# Patient Record
Sex: Female | Born: 1945 | Race: Black or African American | Hispanic: No | Marital: Single | Smoking: Never smoker
Health system: Southern US, Community
[De-identification: ages and names within clinical notes are randomized; demographics above are authoritative.]

## PROBLEM LIST (undated history)

## (undated) DIAGNOSIS — M069 Rheumatoid arthritis, unspecified: Secondary | ICD-10-CM

## (undated) DIAGNOSIS — K219 Gastro-esophageal reflux disease without esophagitis: Secondary | ICD-10-CM

## (undated) DIAGNOSIS — I10 Essential (primary) hypertension: Secondary | ICD-10-CM

## (undated) DIAGNOSIS — E7800 Pure hypercholesterolemia, unspecified: Secondary | ICD-10-CM

## (undated) DIAGNOSIS — C801 Malignant (primary) neoplasm, unspecified: Secondary | ICD-10-CM

## (undated) HISTORY — DX: Pure hypercholesterolemia, unspecified: E78.00

## (undated) HISTORY — DX: Gastro-esophageal reflux disease without esophagitis: K21.9

## (undated) HISTORY — DX: Malignant (primary) neoplasm, unspecified: C80.1

## (undated) HISTORY — PX: TUBAL LIGATION: SHX77

## (undated) HISTORY — PX: DILATION AND CURETTAGE OF UTERUS: SHX78

## (undated) HISTORY — DX: Essential (primary) hypertension: I10

## (undated) HISTORY — PX: COLONOSCOPY W/ BIOPSIES: SHX1374

## (undated) HISTORY — PX: BUNIONECTOMY: SHX129

## (undated) HISTORY — DX: Rheumatoid arthritis, unspecified: M06.9

---

## 2009-07-25 ENCOUNTER — Encounter: Admission: RE | Admit: 2009-07-25 | Discharge: 2009-07-25 | Payer: Self-pay | Admitting: Family Medicine

## 2010-07-26 ENCOUNTER — Encounter: Admission: RE | Admit: 2010-07-26 | Discharge: 2010-07-26 | Payer: Self-pay | Admitting: Family Medicine

## 2011-06-27 ENCOUNTER — Other Ambulatory Visit: Payer: Self-pay | Admitting: Family Medicine

## 2011-06-27 DIAGNOSIS — Z1231 Encounter for screening mammogram for malignant neoplasm of breast: Secondary | ICD-10-CM

## 2011-07-30 ENCOUNTER — Ambulatory Visit
Admission: RE | Admit: 2011-07-30 | Discharge: 2011-07-30 | Disposition: A | Discharge status: Home / Self Care | Payer: Self-pay | Source: Ambulatory Visit | Attending: Family Medicine | Admitting: Family Medicine

## 2011-07-30 DIAGNOSIS — Z1231 Encounter for screening mammogram for malignant neoplasm of breast: Secondary | ICD-10-CM

## 2012-07-21 ENCOUNTER — Other Ambulatory Visit: Payer: Self-pay | Admitting: Rheumatology

## 2012-07-21 ENCOUNTER — Other Ambulatory Visit: Payer: Self-pay | Admitting: Family Medicine

## 2012-07-21 DIAGNOSIS — Z1231 Encounter for screening mammogram for malignant neoplasm of breast: Secondary | ICD-10-CM

## 2012-08-07 ENCOUNTER — Ambulatory Visit
Admission: RE | Admit: 2012-08-07 | Discharge: 2012-08-07 | Disposition: A | Discharge status: Home / Self Care | Payer: Federal, State, Local not specified - PPO | Source: Ambulatory Visit | Attending: Rheumatology | Admitting: Rheumatology

## 2012-08-07 DIAGNOSIS — Z1231 Encounter for screening mammogram for malignant neoplasm of breast: Secondary | ICD-10-CM

## 2013-03-11 ENCOUNTER — Ambulatory Visit (INDEPENDENT_AMBULATORY_CARE_PROVIDER_SITE_OTHER): Payer: Medicare Other | Admitting: Surgery

## 2013-03-11 ENCOUNTER — Encounter (INDEPENDENT_AMBULATORY_CARE_PROVIDER_SITE_OTHER): Payer: Self-pay | Admitting: Surgery

## 2013-03-11 VITALS — BP 204/96 | HR 76 | Resp 24 | Ht 62.5 in | Wt 241.6 lb

## 2013-03-11 DIAGNOSIS — M069 Rheumatoid arthritis, unspecified: Secondary | ICD-10-CM

## 2013-03-11 DIAGNOSIS — C20 Malignant neoplasm of rectum: Secondary | ICD-10-CM | POA: Insufficient documentation

## 2013-03-11 DIAGNOSIS — E66813 Obesity, class 3: Secondary | ICD-10-CM

## 2013-03-11 DIAGNOSIS — C187 Malignant neoplasm of sigmoid colon: Secondary | ICD-10-CM

## 2013-03-11 DIAGNOSIS — K219 Gastro-esophageal reflux disease without esophagitis: Secondary | ICD-10-CM

## 2013-03-11 DIAGNOSIS — E7800 Pure hypercholesterolemia, unspecified: Secondary | ICD-10-CM | POA: Insufficient documentation

## 2013-03-11 DIAGNOSIS — I10 Essential (primary) hypertension: Secondary | ICD-10-CM

## 2013-03-11 HISTORY — DX: Rheumatoid arthritis, unspecified: M06.9

## 2013-03-11 HISTORY — DX: Essential (primary) hypertension: I10

## 2013-03-11 HISTORY — DX: Gastro-esophageal reflux disease without esophagitis: K21.9

## 2013-03-11 HISTORY — DX: Pure hypercholesterolemia, unspecified: E78.00

## 2013-03-11 LAB — BASIC METABOLIC PANEL
Chloride: 102 mEq/L (ref 96–112)
Potassium: 4 mEq/L (ref 3.5–5.3)
Sodium: 139 mEq/L (ref 135–145)

## 2013-03-11 MED ORDER — NEOMYCIN SULFATE 500 MG PO TABS: 1000.0000 mg | ORAL_TABLET | ORAL | Status: DC

## 2013-03-11 MED ORDER — METRONIDAZOLE 500 MG PO TABS: 500.0000 mg | ORAL_TABLET | ORAL | Status: DC

## 2013-03-11 NOTE — Progress Notes (Signed)
Subjective:     Patient ID: Leslie Holmes, female   DOB: 18-Sep-1946, 67 y.o.   MRN: [id number]  HPI  Leslie Holmes  01-Mar-1946 [id number]  Patient Care Team: Devra Dopp, MD as PCP - General (Family Medicine) Ardeth Sportsman, MD as Consulting Physician (General Surgery) Graylin Shiver, MD as Consulting Physician (Gastroenterology)  This patient is a 67 y.o.female who presents today for surgical evaluation at the request of Dr Evette Cristal.   Reason for visit: Newly diagnosed cancer of sigmoid colon  Pleasant elderly woman.  She comes today with her female cousin.  She underwent a screening colonoscopy.  Found to have mass in sigmoid colon.  Not able to be lifted off endoscopically.  Biopsy done.  Tattooed.  Biopsy consistent with cancer.  Sent to me for surgical resection.  Patient normally has a bowel movement twice a day.  Has returned arthritis that can walk about 20 minutes before stopping secondary to joint and back pain.  On methotrexate twice a week.  Had a tubal ligation but no other abdominal surgeries.  No history of skin infections.  No personal nor family history of GI/colon cancer, inflammatory bowel disease, irritable bowel syndrome, allergy such as Celiac Sprue, dietary/dairy problems, colitis, ulcers nor gastritis.  No recent sick contacts/gastroenteritis.  No travel outside the country.  No changes in diet.  No nausea or vomiting.  No history of obstruction.  No rectal bleeding.  Patient Active Problem List   Diagnosis Date Noted  . Cancer of sigmoid colon, 25mm 03/11/2013  . HTN (hypertension) 03/11/2013  . GERD (gastroesophageal reflux disease) 03/11/2013  . Hypercholesteremia 03/11/2013  . Obesity, Class III, BMI 40-49.9 (morbid obesity) 03/11/2013  . Rheumatoid arthritis 03/11/2013    Past Medical History  Diagnosis Date  . Cancer     sigmoid   . HTN (hypertension) 03/11/2013  . GERD (gastroesophageal reflux disease) 03/11/2013  . Hypercholesteremia 03/11/2013  .  Rheumatoid arthritis 03/11/2013    Takes Methotrexate 2x/week     Past Surgical History  Procedure Laterality Date  . Bunionectomy    . Tubal ligation    . Dilation and curettage of uterus      History   Social History  . Marital Status: Single    Spouse Name: N/A    Number of Children: N/A  . Years of Education: N/A   Occupational History  . Not on file.   Social History Main Topics  . Smoking status: Never Smoker   . Smokeless tobacco: Never Used  . Alcohol Use: Yes     Comment: rare  . Drug Use: No  . Sexually Active: Not on file   Other Topics Concern  . Not on file   Social History Narrative  . No narrative on file    Family History  Problem Relation Age of Onset  . Heart disease Mother   . Hypertension Mother     Current Outpatient Prescriptions  Medication Sig Dispense Refill  . folic acid (FOLVITE) 1 MG tablet       . lisinopril-hydrochlorothiazide (PRINZIDE,ZESTORETIC) 20-25 MG per tablet       . methotrexate (RHEUMATREX) 2.5 MG tablet       . pravastatin (PRAVACHOL) 40 MG tablet        No current facility-administered medications for this visit.     No Known Allergies  BP 204/96  Pulse 76  Resp 24  Ht 5' 2.5" (1.588 m)  Wt 241 lb 9.6 oz (109.589 kg)  BMI 43.46  kg/m2  No results found.   Review of Systems  Constitutional: Negative for fever, chills, diaphoresis, appetite change and fatigue.  HENT: Negative for ear pain, sore throat, trouble swallowing, neck pain and ear discharge.   Eyes: Negative for photophobia, discharge and visual disturbance.  Respiratory: Negative for cough, choking, chest tightness and shortness of breath.   Cardiovascular: Negative for chest pain, palpitations and leg swelling.  Gastrointestinal: Negative for nausea, vomiting, abdominal pain, diarrhea, constipation, blood in stool, abdominal distention, anal bleeding and rectal pain.  Endocrine: Negative for cold intolerance and heat intolerance.  Genitourinary:  Negative for dysuria, frequency and difficulty urinating.  Musculoskeletal: Positive for myalgias, back pain and arthralgias. Negative for gait problem.  Skin: Negative for color change, pallor and rash.  Allergic/Immunologic: Positive for immunocompromised state. Negative for environmental allergies and food allergies.  Neurological: Negative for dizziness, speech difficulty, weakness and numbness.  Hematological: Negative for adenopathy.  Psychiatric/Behavioral: Negative for confusion and agitation. The patient is not nervous/anxious.        Objective:   Physical Exam  Constitutional: She is oriented to person, place, and time. She appears well-developed and well-nourished. No distress.  HENT:  Head: Normocephalic.  Mouth/Throat: Oropharynx is clear and moist. No oropharyngeal exudate.  Eyes: Conjunctivae and EOM are normal. Pupils are equal, round, and reactive to light. No scleral icterus.  Neck: Normal range of motion. Neck supple. No tracheal deviation present.  Cardiovascular: Normal rate, regular rhythm and intact distal pulses.   Pulmonary/Chest: Effort normal and breath sounds normal. No stridor. No respiratory distress. She exhibits no tenderness.  Abdominal: Soft. She exhibits no distension and no mass. There is no tenderness. No hernia. Hernia confirmed negative in the right inguinal area and confirmed negative in the left inguinal area.  Obese with moderate panniculus.  Apple -like central obesity  Genitourinary: No vaginal discharge found.  Musculoskeletal: Normal range of motion. She exhibits no tenderness.       Right elbow: She exhibits normal range of motion.       Left elbow: She exhibits normal range of motion.       Right wrist: She exhibits normal range of motion.       Left wrist: She exhibits normal range of motion.       Right hand: Normal strength noted.       Left hand: Normal strength noted.  Lymphadenopathy:       Head (right side): No posterior auricular  adenopathy present.       Head (left side): No posterior auricular adenopathy present.    She has no cervical adenopathy.    She has no axillary adenopathy.       Right: No inguinal adenopathy present.       Left: No inguinal adenopathy present.  Neurological: She is alert and oriented to person, place, and time. No cranial nerve deficit. She exhibits normal muscle tone. Coordination normal.  Skin: Skin is warm and dry. No rash noted. She is not diaphoretic. No erythema.  Psychiatric: She has a normal mood and affect. Her behavior is normal. Judgment and thought content normal.       Assessment:     Cancer of sigmoid colon.  25 mm.     Plan:     I recommended laparoscopic segmental colonic resection:  The anatomy & physiology of the digestive tract was discussed.  The pathophysiology was discussed.  Natural history risks without surgery was discussed.   I feel the risks of no intervention  will lead to serious problems that outweigh the operative risks; therefore, I recommended a partial colectomy to remove the pathology.  Laparoscopic & open techniques were discussed.   Risks such as bleeding, infection, abscess, leak, reoperation, possible ostomy, hernia, heart attack, death, and other risks were discussed.  I noted a good likelihood this will help address the problem.   Goals of post-operative recovery were discussed as well.  We will work to minimize complications.  An educational handout on the pathology was given as well.  Questions were answered.  The patient & her cousin express understanding & wish to proceed with surgery.  We will obtain CEA and CT of chest/abdomen/pelvis for proper staging.  Hold off on CXR since CT scan being done.

## 2013-03-11 NOTE — Patient Instructions (Addendum)
See the Handout(s) we gave you.  Consider surgery.  Please call our office at [phone] if you wish to schedule surgery or if you have further questions / concerns.   Colorectal Cancer Colorectal cancer is an abnormal growth of tissue (tumor) in the colon or rectum that is cancerous (malignant). Unlike noncancerous (benign) tumors, malignant tumors can spread to other parts of your body. The colon is the large bowel or large intestine. The rectum is the last several inches of the colon. CAUSES  The exact cause of colon cancer is unknown.  RISK FACTORS The majority of patients do not have identifiable risk factors, but the following factors may increase your chances of getting colon cancer:  Age. Most colorectal cancers occur in people older than 50 years.  Having abnormal growths (polyps) on the inner wall of the colon or rectum.  Diabetes.  Being African American.  Family history of hereditary nonpolyposis colon cancer. This condition is caused by changes in the genes that are responsible for repairing mismatched DNA.  Family history of familial adenomatous polyposis (FAP). This is a rare, inherited condition in which hundreds of polyps form in the colon and rectum. It is caused by a change in the APC gene. Unless FAP is treated, it usually leads to colorectal cancer by age 37.  Personal history of cancer. A person who has already had colorectal cancer may develop it a second time. Also, women with a history of ovarian, uterine, or breast cancer are at a somewhat higher risk of developing colorectal cancer.  Inflammatory bowel disease, including ulcerative colitis and Crohn's disease.  Being obese or eating a diet that is high in fat (especially animal fat) and low in fiber, fruits, and vegetables.  Smoking. A person who smokes cigarettes may be at increased risk of developing polyps and colorectal cancer.  Heavy alcohol use. SYMPTOMS Early colorectal cancer often does not  cause symptoms. As the cancer grows, symptoms may include:  Diarrhea.  Constipation.  Feeling like the bowel does not empty completely after a bowel movement.  Blood in the stool.  Stools that are narrower than usual.  Abdominal discomfort, pain, bloating, fullness, or cramps.  Unexplained weight loss.  Constant tiredness.  Nausea and vomiting. DIAGNOSIS  Your caregiver will ask about your medical history. He or she may also perform a number of procedures, such as:  A physical exam.  Blood tests. This may include a routine complete blood count and iron level testing. Your caregiver may also check for carcinoembryonic antigen (CEA) and other substances in the blood. Some people who have colorectal cancer have a high CEA level. These levels may be used to follow the activity of your colon cancer.  Chest X-rays, computed tomography (CT) scans, or magnetic resonance imaging (MRI).  Taking a tissue sample (biopsy) from the colon or rectum. The sample is examined under a microscope to look for cancer cells.  Sigmoidoscopy. With this test, your caregiver can see inside your colon. A thin flexible tube (sigmoidoscope) is placed into your rectum. This device has a light source and a tiny video camera in it. Your caregiver uses the sigmoidoscope to look at the last third of your colon.  Colonoscopy. This test is like sigmoidoscopy, but your caregiver looks at the entire colon. This test usually requires medicine that helps you relax (sedative).  Endorectal ultrasound. With this test, your caregiver can see how deep a rectal tumor has grown and whether the cancer has spread to lymph nodes or  other nearby tissues. A tool (probe) is inserted into the rectum. The probe sends out sound waves to the rectum and nearby tissues, and a computer uses the echoes to create a picture. Your cancer will be staged to determine its severity and extent. Staging is a careful attempt to find out the size of the  tumor, whether the cancer has spread, and if so, to what parts of the body. You may need to have more tests to determine the stage of your cancer. The test results will help determine what treatment plan is best for you. STAGES  Stage 0. The cancer is found only in the innermost lining of the colon or rectum.  Stage I. The cancer has grown into the inner wall of the colon or rectum. The cancer has not yet reached the outer wall of the colon.  Stage II. The cancer extends more deeply into or through the wall of the colon or rectum. It may have invaded nearby tissue, but cancer cells have not spread to the lymph nodes.  Stage III. The cancer has spread to nearby lymph nodes but not to other parts of the body.  Stage IV. The cancer has spread to other parts of the body, such as the liver or lungs. Your caregiver may tell you the detailed stage of your cancer. In that case, the stage will include both a number and a letter. TREATMENT  Depending on the type and stage, colorectal cancer may be treated with surgery, radiation therapy, or chemotherapy. Some patients have a combination of these therapies.  Surgery may be done to remove the polyps from your colon. In early stages, your caregiver may be able to do this during a colonoscopy. In later stages, surgery may be done to remove part of your colon.  Radiation therapy uses high-energy rays to kill cancer cells. This is usually recommended for patients with rectal cancer.  Chemotherapy is the use of drugs to kill cancer cells. Caregivers also give chemotherapy to help reduce pain and other problems caused by colorectal cancer. This may be done even if the cancer is not curable. HOME CARE INSTRUCTIONS   Only take over-the-counter or prescription medicines for pain, discomfort, or fever as directed by your caregiver.  Maintain a healthy diet.  Consider joining a support group. This may help you learn to cope with the stress of having colorectal  cancer.  Seek advice to help you manage treatment side effects.  Keep all follow-up appointments as directed by your caregiver.  Inform your cancer specialist if you are admitted to the hospital. SEEK IMMEDIATE MEDICAL CARE IF:   Your diarrhea or constipation does not go away.  You have alternating constipation and diarrhea.  You have blood in your stools.  Your abdominal pain gets worse.  You lose weight without trying.  You notice new fatigue or weakness.  You develop a fever during chemotherapy treatment. Document Released: 10/15/2005 Document Revised: 01/07/2012 Document Reviewed: 10/02/2011 [facility] Patient Information 2013 [place], [place].   COLON PREP INSTRUCTIONS for lower/distal colectomy:   Obtain what you need at a pharmacy of your choice:      Prescriptions for your oral antibiotics (Neomycin & Metronidazole)     A bottle of Milk of Magnesia    2 Fleet enemas (generic form OK to use)   DAY PRIOR TO SURGERY:    1:00pm    o Take 2 oz (4 tablespoons) Milk of Magnesia. o Take 2 Neomycin 500mg  tablets & 2 Metronidazole 500mg  tablets  3:00pm:    o Take 2 Neomycin 500mg  tablets & 2 Metronidazole 500mg  tablets     Bedtime (~10:00pm)  o Take 2 Neomycin 500mg  tablets & 2 Metronidazole 500mg  tablets    Midnight:  Do not eat or drink anything after midnight the night before your surgery.   MORNING OF PROCEDURE:    Remember to not to drink or eat anything that morning    Upon waking up, take the 2 Fleet enemas.  o Use at least 1 hour before leaving house o Try to retain each enema for 5-10 minutes before expelling it.  This should clean your lower colon sufficiently   If you have questions or problems, please call [facility] [phone] speak to someone in the clinic department at our office    ABDOMINAL SURGERY: POST OP INSTRUCTIONS  1. DIET: Follow a light bland diet the first 24 hours after arrival home, such as soup, liquids,  crackers, etc.  Be sure to include lots of fluids daily.  Avoid fast food or heavy meals as your are more likely to get nauseated.  Eat a low fat the next few days after surgery.   2. Take your usually prescribed home medications unless otherwise directed. 3. PAIN CONTROL: a. Pain is best controlled by a usual combination of three different methods TOGETHER: i. Ice/Heat ii. Over the counter pain medication iii. Prescription pain medication b. Most patients will experience some swelling and bruising around the incisions.  Ice packs or heating pads (30-60 minutes up to 6 times a day) will help. Use ice for the first few days to help decrease swelling and bruising, then switch to heat to help relax tight/sore spots and speed recovery.  Some people prefer to use ice alone, heat alone, alternating between ice & heat.  Experiment to what works for you.  Swelling and bruising can take several weeks to resolve.   c. It is helpful to take an over-the-counter pain medication regularly for the first few weeks.  Choose one of the following that works best for you: i. Naproxen (Aleve, etc)  Two 220mg  tabs twice a day ii. Ibuprofen (Advil, etc) Three 200mg  tabs four times a day (every meal & bedtime) iii. Acetaminophen (Tylenol, etc) 500-650mg  four times a day (every meal & bedtime) d. A  prescription for pain medication (such as oxycodone, hydrocodone, etc) should be given to you upon discharge.  Take your pain medication as prescribed.  i. If you are having problems/concerns with the prescription medicine (does not control pain, nausea, vomiting, rash, itching, etc), please call us [phone] to see if we need to switch you to a different pain medicine that will work better for you and/or control your side effect better. ii. If you need a refill on your pain medication, please contact your pharmacy.  They will contact our office to request authorization. Prescriptions will not be filled after 5 pm or on  week-ends. 4. Avoid getting constipated.  Between the surgery and the pain medications, it is common to experience some constipation.  Increasing fluid intake and taking a fiber supplement (such as Metamucil, Citrucel, FiberCon, MiraLax, etc) 1-2 times a day regularly will usually help prevent this problem from occurring.  A mild laxative (prune juice, Milk of Magnesia, MiraLax, etc) should be taken according to package directions if there are no bowel movements after 48 hours.   5. Watch out for diarrhea.  If you have many loose bowel movements, simplify your diet to bland foods &  liquids for a few days.  Stop any stool softeners and decrease your fiber supplement.  Switching to mild anti-diarrheal medications (Kayopectate, Pepto Bismol) can help.  If this worsens or does not improve, please call us. 6. Wash / shower every day.  You may shower over the incision / wound.  Avoid baths until the skin is fully healed.  Continue to shower over incision(s) after the dressing is off. 7. Remove your waterproof bandages 5 days after surgery.  You may leave the incision open to air.  You may replace a dressing/Band-Aid to cover the incision for comfort if you wish. 8. ACTIVITIES as tolerated:   a. You may resume regular (light) daily activities beginning the next day-such as daily self-care, walking, climbing stairs-gradually increasing activities as tolerated.  If you can walk 30 minutes without difficulty, it is safe to try more intense activity such as jogging, treadmill, bicycling, low-impact aerobics, swimming, etc. b. Save the most intensive and strenuous activity for last such as sit-ups, heavy lifting, contact sports, etc  Refrain from any heavy lifting or straining until you are off narcotics for pain control.   c. DO NOT PUSH THROUGH PAIN.  Let pain be your guide: If it hurts to do something, don't do it.  Pain is your body warning you to avoid that activity for another week until the pain goes  down. d. You may drive when you are no longer taking prescription pain medication, you can comfortably wear a seatbelt, and you can safely maneuver your car and apply brakes. e. Bonita Quin may have sexual intercourse when it is comfortable.  9. FOLLOW UP in our office a. Please call [facility] at [phone] to set up an appointment to see your surgeon in the office for a follow-up appointment approximately 1-2 weeks after your surgery. b. Make sure that you call for this appointment the day you arrive home to insure a convenient appointment time. 10. IF YOU HAVE DISABILITY OR FAMILY LEAVE FORMS, BRING THEM TO THE OFFICE FOR PROCESSING.  DO NOT GIVE THEM TO YOUR DOCTOR.   WHEN TO CALL us [phone]: 1. Poor pain control 2. Reactions / problems with new medications (rash/itching, nausea, etc)  3. Fever over 101.5 F (38.5 C) 4. Inability to urinate 5. Nausea and/or vomiting 6. Worsening swelling or bruising 7. Continued bleeding from incision. 8. Increased pain, redness, or drainage from the incision  The clinic staff is available to answer your questions during regular business hours (8:30am-5pm).  Please don't hesitate to call and ask to speak to one of our nurses for clinical concerns.   A surgeon from [facility] is always on call at the hospitals   If you have a medical emergency, go to the nearest emergency room or call 911.    [facility] [address], [place], [place]  [address] ? MAIN: [phone] ? TOLL FREE: [phone] ? FAX [phone] [email or web address]   GETTING TO GOOD BOWEL HEALTH. Irregular bowel habits such as constipation and diarrhea can lead to many problems over time.  Having one soft bowel movement a day is the most important way to prevent further problems.  The anorectal canal is designed to handle stretching and feces to safely manage our ability to get rid of solid waste (feces, poop, stool) out of  our body.  BUT, hard constipated stools can act like ripping concrete bricks and diarrhea can be a burning fire to this very sensitive area of  our body, causing inflamed hemorrhoids, anal fissures, increasing risk is perirectal abscesses, abdominal pain/bloating, an making irritable bowel worse.     The goal: ONE SOFT BOWEL MOVEMENT A DAY!  To have soft, regular bowel movements:    Drink at least 8 tall glasses of water a day.     Take plenty of fiber.  Fiber is the undigested part of plant food that passes into the colon, acting s "natures broom" to encourage bowel motility and movement.  Fiber can absorb and hold large amounts of water. This results in a larger, bulkier stool, which is soft and easier to pass. Work gradually over several weeks up to 6 servings a day of fiber (25g a day even more if needed) in the form of: o Vegetables -- Root (potatoes, carrots, turnips), leafy green (lettuce, salad greens, celery, spinach), or cooked high residue (cabbage, broccoli, etc) o Fruit -- Fresh (unpeeled skin & pulp), Dried (prunes, apricots, cherries, etc ),  or stewed ( applesauce)  o Whole grain breads, pasta, etc (whole wheat)  o Bran cereals    Bulking Agents -- This type of water-retaining fiber generally is easily obtained each day by one of the following:  o Psyllium bran -- The psyllium plant is remarkable because its ground seeds can retain so much water. This product is available as Metamucil, Konsyl, Effersyllium, Per Diem Fiber, or the less expensive generic preparation in drug and health food stores. Although labeled a laxative, it really is not a laxative.  o Methylcellulose -- This is another fiber derived from wood which also retains water. It is available as Citrucel. o Polyethylene Glycol - and "artificial" fiber commonly called Miralax or Glycolax.  It is helpful for people with gassy or bloated feelings with regular fiber o Flax Seed - a less gassy fiber than psyllium   No reading or  other relaxing activity while on the toilet. If bowel movements take longer than 5 minutes, you are too constipated   AVOID CONSTIPATION.  High fiber and water intake usually takes care of this.  Sometimes a laxative is needed to stimulate more frequent bowel movements, but    Laxatives are not a good long-term solution as it can wear the colon out. o Osmotics (Milk of Magnesia, Fleets phosphosoda, Magnesium citrate, MiraLax, GoLytely) are safer than  o Stimulants (Senokot, Castor Oil, Dulcolax, Ex Lax)    o Do not take laxatives for more than 7days in a row.    IF SEVERELY CONSTIPATED, try a Bowel Retraining Program: o Do not use laxatives.  o Eat a diet high in roughage, such as bran cereals and leafy vegetables.  o Drink six (6) ounces of prune or apricot juice each morning.  o Eat two (2) large servings of stewed fruit each day.  o Take one (1) heaping tablespoon of a psyllium-based bulking agent twice a day. Use sugar-free sweetener when possible to avoid excessive calories.  o Eat a normal breakfast.  o Set aside 15 minutes after breakfast to sit on the toilet, but do not strain to have a bowel movement.  o If you do not have a bowel movement by the third day, use an enema and repeat the above steps.    Controlling diarrhea o Switch to liquids and simpler foods for a few days to avoid stressing your intestines further. o Avoid dairy products (especially milk & ice cream) for a short time.  The intestines often can lose the ability to digest lactose when stressed.  o Avoid foods that cause gassiness or bloating.  Typical foods include beans and other legumes, cabbage, broccoli, and dairy foods.  Every person has some sensitivity to other foods, so listen to our body and avoid those foods that trigger problems for you. o Adding fiber (Citrucel, Metamucil, psyllium, Miralax) gradually can help thicken stools by absorbing excess fluid and retrain the intestines to act more normally.  Slowly  increase the dose over a few weeks.  Too much fiber too soon can backfire and cause cramping & bloating. o Probiotics (such as active yogurt, Align, etc) may help repopulate the intestines and colon with normal bacteria and calm down a sensitive digestive tract.  Most studies show it to be of mild help, though, and such products can be costly. o Medicines:   Bismuth subsalicylate (ex. Kayopectate, Pepto Bismol) every 30 minutes for up to 6 doses can help control diarrhea.  Avoid if pregnant.   Loperamide (Immodium) can slow down diarrhea.  Start with two tablets (4mg  total) first and then try one tablet every 6 hours.  Avoid if you are having fevers or severe pain.  If you are not better or start feeling worse, stop all medicines and call your doctor for advice o Call your doctor if you are getting worse or not better.  Sometimes further testing (cultures, endoscopy, X-ray studies, bloodwork, etc) may be needed to help diagnose and treat the cause of the diarrhea.  Managing Pain  Pain after surgery or related to activity is often due to strain/injury to muscle, tendon, nerves and/or incisions.  This pain is usually short-term and will improve in a few months.   Many people find it helpful to do the following things TOGETHER to help speed the process of healing and to get back to regular activity more quickly:  1. Avoid heavy physical activity a.  no lifting greater than 20 pounds b. Do not "push through" the pain.  Listen to your body and avoid positions and maneuvers than reproduce the pain c. Walking is okay as tolerated, but go slowly and stop when getting sore.  d. Remember: If it hurts to do it, then don't do it! 2. Take Anti-inflammatory medication  a. Take with food/snack around the clock for 1-2 weeks i. This helps the muscle and nerve tissues become less irritable and calm down faster b. Choose ONE of the following over-the-counter medications: i. Naproxen 220mg  tabs (ex. Aleve) 1-2  pills twice a day  ii. Ibuprofen 200mg  tabs (ex. Advil, Motrin) 3-4 pills with every meal and just before bedtime iii. Acetaminophen 500mg  tabs (Tylenol) 1-2 pills with every meal and just before bedtime 3. Use a Heating pad or Ice/Cold Pack a. 4-6 times a day b. May use warm bath/hottub  or showers 4. Try Gentle Massage and/or Stretching  a. at the area of pain many times a day b. stop if you feel pain - do not overdo it  Try these steps together to help you body heal faster and avoid making things get worse.  Doing just one of these things may not be enough.    If you are not getting better after two weeks or are noticing you are getting worse, contact our office for further advice; we may need to re-evaluate you & see what other things we can do to help.

## 2013-03-12 ENCOUNTER — Encounter (INDEPENDENT_AMBULATORY_CARE_PROVIDER_SITE_OTHER): Payer: Self-pay | Admitting: Surgery

## 2013-03-12 LAB — CEA: CEA: 0.7 ng/mL (ref 0.0–5.0)

## 2013-03-13 ENCOUNTER — Ambulatory Visit
Admission: RE | Admit: 2013-03-13 | Discharge: 2013-03-13 | Disposition: A | Discharge status: Home / Self Care | Payer: Medicare Other | Source: Ambulatory Visit | Attending: Surgery | Admitting: Surgery

## 2013-03-13 ENCOUNTER — Other Ambulatory Visit: Payer: Federal, State, Local not specified - PPO

## 2013-03-13 DIAGNOSIS — C187 Malignant neoplasm of sigmoid colon: Secondary | ICD-10-CM

## 2013-03-13 MED ORDER — IOHEXOL 300 MG/ML  SOLN
125.0000 mL | Freq: Once | INTRAMUSCULAR | Status: AC | PRN
  Administered 2013-03-13: 125 mL via INTRAVENOUS

## 2013-03-16 ENCOUNTER — Telehealth (INDEPENDENT_AMBULATORY_CARE_PROVIDER_SITE_OTHER): Payer: Self-pay

## 2013-03-16 NOTE — Telephone Encounter (Signed)
Patient called back and was given below message.

## 2013-03-16 NOTE — Telephone Encounter (Signed)
LMOM at home and cell. I wanted to give her the good news about her CT Chest,Abd/pelvis scans showing no metastatic disease per Dr Michaell Cowing.

## 2013-03-19 ENCOUNTER — Encounter (INDEPENDENT_AMBULATORY_CARE_PROVIDER_SITE_OTHER): Payer: Self-pay

## 2013-03-30 ENCOUNTER — Encounter (HOSPITAL_COMMUNITY): Payer: Self-pay | Admitting: Pharmacy Technician

## 2013-04-02 ENCOUNTER — Encounter (HOSPITAL_COMMUNITY)
Admission: RE | Admit: 2013-04-02 | Discharge: 2013-04-02 | Disposition: A | Discharge status: Home / Self Care | Payer: Medicare Other | Source: Ambulatory Visit | Attending: Surgery | Admitting: Surgery

## 2013-04-02 ENCOUNTER — Encounter (HOSPITAL_COMMUNITY): Payer: Self-pay

## 2013-04-02 LAB — ABO/RH: ABO/RH(D): B NEG

## 2013-04-02 LAB — CBC
MCH: 32.4 pg (ref 26.0–34.0)
Platelets: 280 10*3/uL (ref 150–400)
RBC: 4.29 MIL/uL (ref 3.87–5.11)

## 2013-04-02 NOTE — Patient Instructions (Addendum)
22 Leslie Holmes  04/02/2013   Your procedure is scheduled on:  04/10/13  FRIDAY  Report to [facility] at  0530     AM.  Call this number if you have problems the morning of surgery: [phone]       Remember:  NOTHING BY MOUTH EXCEPT MEDICATIONS AFTER MIDNIGHT Thursday NIGHT   Take these medicines the morning of surgery with A SIP OF WATER: NONE BOWEL PREP AS PER OFFICE  .  Contacts, dentures or partial plates can not be worn to surgery  Leave suitcase in the car. After surgery it may be brought to your room.  For patients admitted to the hospital, checkout time is 11:00 AM day of  discharge.             SPECIAL INSTRUCTIONS- SEE [facility] PREPARING FOR SURGERY INSTRUCTION SHEET-     DO NOT WEAR JEWELRY, LOTIONS, POWDERS, OR PERFUMES.  WOMEN-- DO NOT SHAVE LEGS OR UNDERARMS FOR 12 HOURS BEFORE SHOWERS. MEN MAY SHAVE FACE.  Patients discharged the day of surgery will not be allowed to drive home. IF going home the day of surgery, you must have a driver and someone to stay with you for the first 24 hours  Name and phone number of your driver:        admission                                                                Please read over the following fact sheets that you were given: MRSA Information, Incentive Spirometry Sheet, Blood Transfusion Sheet  Information                                                                                   Manav Pierotti  PST 336  [phone]                 FAILURE TO FOLLOW THESE INSTRUCTIONS MAY RESULT IN  CANCELLATION   OF YOUR SURGERY                                                  Patient Signature _____________________________

## 2013-04-02 NOTE — Progress Notes (Signed)
Per Dr Rica Mast, anesthesia, OK for patient to take Methyltrexate next week.  CT chest 03/13/13 EPIC, BMET 03/11/13 EPIC

## 2013-04-09 MED ORDER — BUPIVACAINE 0.25 % ON-Q PUMP DUAL CATH 300 ML
300.0000 mL | INJECTION | Status: DC
  Filled 2013-04-09: qty 300

## 2013-04-09 MED ORDER — GENTAMICIN SULFATE 40 MG/ML IJ SOLN
INTRAMUSCULAR | Status: DC
  Filled 2013-04-09: qty 6

## 2013-04-10 ENCOUNTER — Inpatient Hospital Stay (HOSPITAL_COMMUNITY)
Admission: RE | Admit: 2013-04-10 | Discharge: 2013-04-13 | DRG: 333 | Disposition: A | Discharge status: Home / Self Care | Payer: Medicare Other | Source: Ambulatory Visit | Attending: Surgery | Admitting: Surgery

## 2013-04-10 ENCOUNTER — Inpatient Hospital Stay (HOSPITAL_COMMUNITY): Payer: Medicare Other | Admitting: Anesthesiology

## 2013-04-10 ENCOUNTER — Encounter (HOSPITAL_COMMUNITY): Payer: Self-pay | Admitting: Anesthesiology

## 2013-04-10 ENCOUNTER — Encounter (HOSPITAL_COMMUNITY)
Admission: RE | Disposition: A | Discharge status: Home / Self Care | Payer: Self-pay | Source: Ambulatory Visit | Attending: Surgery

## 2013-04-10 ENCOUNTER — Encounter (HOSPITAL_COMMUNITY): Payer: Self-pay | Admitting: *Deleted

## 2013-04-10 DIAGNOSIS — C20 Malignant neoplasm of rectum: Principal | ICD-10-CM | POA: Diagnosis present

## 2013-04-10 DIAGNOSIS — K219 Gastro-esophageal reflux disease without esophagitis: Secondary | ICD-10-CM | POA: Diagnosis present

## 2013-04-10 DIAGNOSIS — C187 Malignant neoplasm of sigmoid colon: Secondary | ICD-10-CM

## 2013-04-10 DIAGNOSIS — Z01812 Encounter for preprocedural laboratory examination: Secondary | ICD-10-CM

## 2013-04-10 DIAGNOSIS — I10 Essential (primary) hypertension: Secondary | ICD-10-CM | POA: Diagnosis present

## 2013-04-10 DIAGNOSIS — Z6841 Body Mass Index (BMI) 40.0 and over, adult: Secondary | ICD-10-CM

## 2013-04-10 DIAGNOSIS — E7800 Pure hypercholesterolemia, unspecified: Secondary | ICD-10-CM | POA: Diagnosis present

## 2013-04-10 DIAGNOSIS — M069 Rheumatoid arthritis, unspecified: Secondary | ICD-10-CM | POA: Diagnosis present

## 2013-04-10 HISTORY — PX: LAPAROSCOPIC PARTIAL COLECTOMY: SHX5907

## 2013-04-10 LAB — TYPE AND SCREEN
ABO/RH(D): B NEG
Antibody Screen: NEGATIVE

## 2013-04-10 SURGERY — LAPAROSCOPIC PARTIAL COLECTOMY
Anesthesia: General | Wound class: Contaminated

## 2013-04-10 MED ORDER — BUPIVACAINE ON-Q PAIN PUMP (FOR ORDER SET NO CHG)
INJECTION | Status: DC
  Filled 2013-04-10: qty 1

## 2013-04-10 MED ORDER — SODIUM CHLORIDE 0.9 % IJ SOLN: 3.0000 mL | INTRAMUSCULAR | Status: DC | PRN

## 2013-04-10 MED ORDER — SACCHAROMYCES BOULARDII 250 MG PO CAPS
250.0000 mg | ORAL_CAPSULE | Freq: Two times a day (BID) | ORAL | Status: DC
  Administered 2013-04-10 – 2013-04-13 (×6): 250 mg via ORAL
  Filled 2013-04-10 (×7): qty 1

## 2013-04-10 MED ORDER — ALVIMOPAN 12 MG PO CAPS
12.0000 mg | ORAL_CAPSULE | Freq: Two times a day (BID) | ORAL | Status: DC
  Administered 2013-04-11: 12 mg via ORAL
  Filled 2013-04-10 (×4): qty 1

## 2013-04-10 MED ORDER — HEPARIN SODIUM (PORCINE) 5000 UNIT/ML IJ SOLN
5000.0000 [IU] | Freq: Three times a day (TID) | INTRAMUSCULAR | Status: DC
  Administered 2013-04-11 – 2013-04-13 (×7): 5000 [IU] via SUBCUTANEOUS
  Filled 2013-04-10 (×10): qty 1

## 2013-04-10 MED ORDER — ALUM & MAG HYDROXIDE-SIMETH 200-200-20 MG/5ML PO SUSP: 30.0000 mL | Freq: Four times a day (QID) | ORAL | Status: DC | PRN

## 2013-04-10 MED ORDER — ONDANSETRON HCL 4 MG/2ML IJ SOLN
INTRAMUSCULAR | Status: DC | PRN
  Administered 2013-04-10: 4 mg via INTRAVENOUS

## 2013-04-10 MED ORDER — MIDAZOLAM HCL 5 MG/5ML IJ SOLN
INTRAMUSCULAR | Status: DC | PRN
  Administered 2013-04-10: 2 mg via INTRAVENOUS

## 2013-04-10 MED ORDER — BUPIVACAINE 0.25 % ON-Q PUMP DUAL CATH 300 ML
INJECTION | Status: DC | PRN
  Administered 2013-04-10: 300 mL

## 2013-04-10 MED ORDER — NEOSTIGMINE METHYLSULFATE 1 MG/ML IJ SOLN
INTRAMUSCULAR | Status: DC | PRN
  Administered 2013-04-10: 5 mg via INTRAVENOUS

## 2013-04-10 MED ORDER — DEXAMETHASONE SODIUM PHOSPHATE 10 MG/ML IJ SOLN
INTRAMUSCULAR | Status: DC | PRN
  Administered 2013-04-10: 5 mg via INTRAVENOUS

## 2013-04-10 MED ORDER — ALVIMOPAN 12 MG PO CAPS
12.0000 mg | ORAL_CAPSULE | Freq: Once | ORAL | Status: AC
  Administered 2013-04-10: 12 mg via ORAL
  Filled 2013-04-10: qty 1

## 2013-04-10 MED ORDER — LACTATED RINGERS IR SOLN
Status: DC | PRN
  Administered 2013-04-10: 3000 mL

## 2013-04-10 MED ORDER — DEXTROSE 5 % IV SOLN
1.0000 g | Freq: Four times a day (QID) | INTRAVENOUS | Status: AC
  Administered 2013-04-10 – 2013-04-11 (×3): 1 g via INTRAVENOUS
  Filled 2013-04-10 (×3): qty 1

## 2013-04-10 MED ORDER — DEXTROSE 5 % IV SOLN: 2.0000 g | Freq: Two times a day (BID) | INTRAVENOUS | Status: DC

## 2013-04-10 MED ORDER — SODIUM CHLORIDE 0.9 % IV SOLN: 250.0000 mL | INTRAVENOUS | Status: DC | PRN

## 2013-04-10 MED ORDER — ROCURONIUM BROMIDE 100 MG/10ML IV SOLN
INTRAVENOUS | Status: DC | PRN
  Administered 2013-04-10: 50 mg via INTRAVENOUS

## 2013-04-10 MED ORDER — KCL IN DEXTROSE-NACL 40-5-0.9 MEQ/L-%-% IV SOLN
INTRAVENOUS | Status: DC
  Administered 2013-04-10 – 2013-04-13 (×3): via INTRAVENOUS
  Filled 2013-04-10 (×6): qty 1000

## 2013-04-10 MED ORDER — DEXTROSE 5 % IV SOLN
2.0000 g | INTRAVENOUS | Status: AC
  Administered 2013-04-10: 2 g via INTRAVENOUS
  Filled 2013-04-10: qty 2

## 2013-04-10 MED ORDER — CALCIUM CARBONATE 1250 (500 CA) MG PO TABS
1250.0000 mg | ORAL_TABLET | Freq: Every day | ORAL | Status: DC
  Administered 2013-04-10 – 2013-04-13 (×4): 1250 mg via ORAL
  Filled 2013-04-10 (×5): qty 1

## 2013-04-10 MED ORDER — SODIUM CHLORIDE 0.9 % IV SOLN
INTRAVENOUS | Status: DC | PRN
  Administered 2013-04-10: 11:00:00 via INTRAPERITONEAL

## 2013-04-10 MED ORDER — PROMETHAZINE HCL 25 MG/ML IJ SOLN: 6.2500 mg | INTRAMUSCULAR | Status: DC | PRN

## 2013-04-10 MED ORDER — OXYCODONE HCL 5 MG PO TABS: 5.0000 mg | ORAL_TABLET | Freq: Once | ORAL | Status: DC | PRN

## 2013-04-10 MED ORDER — LACTATED RINGERS IV SOLN
INTRAVENOUS | Status: DC | PRN
  Administered 2013-04-10 (×4): via INTRAVENOUS

## 2013-04-10 MED ORDER — 0.9 % SODIUM CHLORIDE (POUR BTL) OPTIME
TOPICAL | Status: DC | PRN
  Administered 2013-04-10: 1000 mL

## 2013-04-10 MED ORDER — MEPERIDINE HCL 50 MG/ML IJ SOLN: 6.2500 mg | INTRAMUSCULAR | Status: DC | PRN

## 2013-04-10 MED ORDER — METOCLOPRAMIDE HCL 5 MG/ML IJ SOLN
INTRAMUSCULAR | Status: DC | PRN
  Administered 2013-04-10: 10 mg via INTRAVENOUS

## 2013-04-10 MED ORDER — HYDROMORPHONE HCL PF 1 MG/ML IJ SOLN: 0.2500 mg | INTRAMUSCULAR | Status: DC | PRN

## 2013-04-10 MED ORDER — OXYCODONE HCL 5 MG/5ML PO SOLN
5.0000 mg | Freq: Once | ORAL | Status: DC | PRN
  Filled 2013-04-10: qty 5

## 2013-04-10 MED ORDER — CISATRACURIUM BESYLATE (PF) 10 MG/5ML IV SOLN
INTRAVENOUS | Status: DC | PRN
  Administered 2013-04-10: 4 mg via INTRAVENOUS
  Administered 2013-04-10: 6 mg via INTRAVENOUS
  Administered 2013-04-10: 2 mg via INTRAVENOUS

## 2013-04-10 MED ORDER — METOPROLOL TARTRATE 1 MG/ML IV SOLN
5.0000 mg | Freq: Four times a day (QID) | INTRAVENOUS | Status: DC | PRN
  Filled 2013-04-10: qty 5

## 2013-04-10 MED ORDER — ACETAMINOPHEN 500 MG PO TABS
1000.0000 mg | ORAL_TABLET | Freq: Three times a day (TID) | ORAL | Status: DC
  Administered 2013-04-10 – 2013-04-13 (×9): 1000 mg via ORAL
  Filled 2013-04-10 (×11): qty 2

## 2013-04-10 MED ORDER — SODIUM CHLORIDE 0.9 % IJ SOLN: 3.0000 mL | Freq: Two times a day (BID) | INTRAMUSCULAR | Status: DC

## 2013-04-10 MED ORDER — OXYCODONE HCL 5 MG PO TABS: 5.0000 mg | ORAL_TABLET | ORAL | Status: DC | PRN

## 2013-04-10 MED ORDER — HYDROMORPHONE HCL PF 1 MG/ML IJ SOLN
0.5000 mg | INTRAMUSCULAR | Status: DC | PRN
  Administered 2013-04-10: 1 mg via INTRAVENOUS
  Administered 2013-04-10: 0.5 mg via INTRAVENOUS
  Administered 2013-04-11: 1 mg via INTRAVENOUS
  Filled 2013-04-10 (×3): qty 1

## 2013-04-10 MED ORDER — PROPOFOL 10 MG/ML IV BOLUS
INTRAVENOUS | Status: DC | PRN
  Administered 2013-04-10: 180 mg via INTRAVENOUS

## 2013-04-10 MED ORDER — PROMETHAZINE HCL 25 MG/ML IJ SOLN: 12.5000 mg | Freq: Four times a day (QID) | INTRAMUSCULAR | Status: DC | PRN

## 2013-04-10 MED ORDER — FOLIC ACID 1 MG PO TABS
1.0000 mg | ORAL_TABLET | Freq: Every day | ORAL | Status: DC
  Administered 2013-04-10 – 2013-04-13 (×4): 1 mg via ORAL
  Filled 2013-04-10 (×4): qty 1

## 2013-04-10 MED ORDER — DIPHENHYDRAMINE HCL 50 MG/ML IJ SOLN: 12.5000 mg | Freq: Four times a day (QID) | INTRAMUSCULAR | Status: DC | PRN

## 2013-04-10 MED ORDER — GLYCOPYRROLATE 0.2 MG/ML IJ SOLN
INTRAMUSCULAR | Status: DC | PRN
  Administered 2013-04-10: .8 mg via INTRAVENOUS
  Administered 2013-04-10: 0.2 mg via INTRAVENOUS

## 2013-04-10 MED ORDER — BUPIVACAINE-EPINEPHRINE PF 0.25-1:200000 % IJ SOLN
INTRAMUSCULAR | Status: DC | PRN
  Administered 2013-04-10: 80 mL

## 2013-04-10 MED ORDER — STERILE WATER FOR IRRIGATION IR SOLN
Status: DC | PRN
  Administered 2013-04-10: 1500 mL

## 2013-04-10 MED ORDER — SUFENTANIL CITRATE 50 MCG/ML IV SOLN
INTRAVENOUS | Status: DC | PRN
  Administered 2013-04-10 (×5): 5 ug via INTRAVENOUS
  Administered 2013-04-10: 10 ug via INTRAVENOUS
  Administered 2013-04-10 (×3): 5 ug via INTRAVENOUS
  Administered 2013-04-10 (×3): 10 ug via INTRAVENOUS
  Administered 2013-04-10: 5 ug via INTRAVENOUS

## 2013-04-10 MED ORDER — ACETAMINOPHEN 10 MG/ML IV SOLN: 1000.0000 mg | Freq: Once | INTRAVENOUS | Status: DC | PRN

## 2013-04-10 MED ORDER — LACTATED RINGERS IV BOLUS (SEPSIS): 1000.0000 mL | Freq: Three times a day (TID) | INTRAVENOUS | Status: AC | PRN

## 2013-04-10 MED ORDER — HYDROMORPHONE HCL PF 1 MG/ML IJ SOLN
INTRAMUSCULAR | Status: DC | PRN
  Administered 2013-04-10: 0.5 mg via INTRAVENOUS
  Administered 2013-04-10: 1 mg via INTRAVENOUS

## 2013-04-10 MED ORDER — LIDOCAINE HCL (CARDIAC) 20 MG/ML IV SOLN
INTRAVENOUS | Status: DC | PRN
  Administered 2013-04-10: 50 mg via INTRAVENOUS

## 2013-04-10 MED ORDER — ZOLPIDEM TARTRATE 5 MG PO TABS: 5.0000 mg | ORAL_TABLET | Freq: Every evening | ORAL | Status: DC | PRN

## 2013-04-10 MED ORDER — ADULT MULTIVITAMIN W/MINERALS CH
1.0000 | ORAL_TABLET | Freq: Every day | ORAL | Status: DC
  Administered 2013-04-10 – 2013-04-13 (×4): 1 via ORAL
  Filled 2013-04-10 (×4): qty 1

## 2013-04-10 MED ORDER — DIPHENHYDRAMINE HCL 12.5 MG/5ML PO ELIX: 12.5000 mg | ORAL_SOLUTION | Freq: Four times a day (QID) | ORAL | Status: DC | PRN

## 2013-04-10 MED ORDER — HEPARIN SODIUM (PORCINE) 5000 UNIT/ML IJ SOLN
5000.0000 [IU] | Freq: Once | INTRAMUSCULAR | Status: AC
  Administered 2013-04-10: 5000 [IU] via SUBCUTANEOUS
  Filled 2013-04-10: qty 1

## 2013-04-10 SURGICAL SUPPLY — 84 items
APPLIER CLIP 5 13 M/L LIGAMAX5 (MISCELLANEOUS)
APPLIER CLIP ROT 10 11.4 M/L (STAPLE)
BLADE EXTENDED COATED 6.5IN (ELECTRODE) ×2 IMPLANT
BLADE HEX COATED 2.75 (ELECTRODE) ×2 IMPLANT
BLADE SURG SZ10 CARB STEEL (BLADE) ×2 IMPLANT
CABLE HIGH FREQUENCY MONO STRZ (ELECTRODE) ×2 IMPLANT
CANISTER SUCTION 2500CC (MISCELLANEOUS) ×2 IMPLANT
CATH KIT ON Q 7.5IN SLV (PAIN MANAGEMENT) ×2 IMPLANT
CELLS DAT CNTRL 66122 CELL SVR (MISCELLANEOUS) IMPLANT
CLIP APPLIE 5 13 M/L LIGAMAX5 (MISCELLANEOUS) IMPLANT
CLIP APPLIE ROT 10 11.4 M/L (STAPLE) IMPLANT
CLOTH BEACON ORANGE TIMEOUT ST (SAFETY) ×2 IMPLANT
COVER MAYO STAND STRL (DRAPES) ×2 IMPLANT
DECANTER SPIKE VIAL GLASS SM (MISCELLANEOUS) ×2 IMPLANT
DRAIN CHANNEL 19F RND (DRAIN) IMPLANT
DRAPE LAPAROSCOPIC ABDOMINAL (DRAPES) ×2 IMPLANT
DRAPE LG THREE QUARTER DISP (DRAPES) ×2 IMPLANT
DRAPE UTILITY 15X26 (DRAPE) ×4 IMPLANT
DRAPE WARM FLUID 44X44 (DRAPE) ×4 IMPLANT
DRSG OPSITE POSTOP 4X8 (GAUZE/BANDAGES/DRESSINGS) ×2 IMPLANT
DRSG TEGADERM 2-3/8X2-3/4 SM (GAUZE/BANDAGES/DRESSINGS) ×10 IMPLANT
DRSG TEGADERM 4X4.75 (GAUZE/BANDAGES/DRESSINGS) IMPLANT
ELECT REM PT RETURN 9FT ADLT (ELECTROSURGICAL) ×2
ELECTRODE REM PT RTRN 9FT ADLT (ELECTROSURGICAL) ×1 IMPLANT
GAUZE SPONGE 2X2 8PLY STRL LF (GAUZE/BANDAGES/DRESSINGS) ×2 IMPLANT
GLOVE BIOGEL PI IND STRL 7.0 (GLOVE) ×2 IMPLANT
GLOVE BIOGEL PI INDICATOR 7.0 (GLOVE) ×2
GLOVE ECLIPSE 8.0 STRL XLNG CF (GLOVE) ×8 IMPLANT
GLOVE INDICATOR 8.0 STRL GRN (GLOVE) ×4 IMPLANT
GOWN STRL NON-REIN LRG LVL3 (GOWN DISPOSABLE) ×4 IMPLANT
GOWN STRL REIN XL XLG (GOWN DISPOSABLE) ×14 IMPLANT
KIT BASIN OR (CUSTOM PROCEDURE TRAY) ×4 IMPLANT
LEGGING LITHOTOMY PAIR STRL (DRAPES) ×2 IMPLANT
LIGASURE IMPACT 36 18CM CVD LR (INSTRUMENTS) IMPLANT
LUBRICANT JELLY K Y 4OZ (MISCELLANEOUS) ×2 IMPLANT
NS IRRIG 1000ML POUR BTL (IV SOLUTION) ×4 IMPLANT
PENCIL BUTTON HOLSTER BLD 10FT (ELECTRODE) ×4 IMPLANT
RTRCTR WOUND ALEXIS 18CM MED (MISCELLANEOUS)
SCISSORS LAP 5X35 DISP (ENDOMECHANICALS) ×2 IMPLANT
SEALER TISSUE G2 CVD JAW 35 (ENDOMECHANICALS) IMPLANT
SEALER TISSUE G2 CVD JAW 45CM (ENDOMECHANICALS)
SEALER TISSUE G2 STRG ARTC 35C (ENDOMECHANICALS) ×2 IMPLANT
SET IRRIG TUBING LAPAROSCOPIC (IRRIGATION / IRRIGATOR) ×2 IMPLANT
SLEEVE XCEL OPT CAN 5 100 (ENDOMECHANICALS) ×8 IMPLANT
SPONGE GAUZE 2X2 STER 10/PKG (GAUZE/BANDAGES/DRESSINGS) ×2
SPONGE GAUZE 4X4 12PLY (GAUZE/BANDAGES/DRESSINGS) IMPLANT
SPONGE LAP 18X18 X RAY DECT (DISPOSABLE) ×2 IMPLANT
STAPLER CIRC ILS CVD 33MM 37CM (STAPLE) ×2 IMPLANT
STAPLER CUT CVD 40MM BLUE (STAPLE) ×2 IMPLANT
STAPLER VISISTAT 35W (STAPLE) IMPLANT
SUCTION POOLE TIP (SUCTIONS) ×2 IMPLANT
SUT ETHILON 2 0 PS N (SUTURE) IMPLANT
SUT PDS AB 1 CTX 36 (SUTURE) ×2 IMPLANT
SUT PDS AB 1 TP1 96 (SUTURE) IMPLANT
SUT PDS AB 2-0 CT2 27 (SUTURE) IMPLANT
SUT PDS AB 4-0 PS2 18 (SUTURE) ×2 IMPLANT
SUT PROLENE 0 CT 2 (SUTURE) ×2 IMPLANT
SUT PROLENE 2 0 CT2 30 (SUTURE) ×2 IMPLANT
SUT PROLENE 2 0 KS (SUTURE) IMPLANT
SUT SILK 2 0 (SUTURE) ×1
SUT SILK 2 0 SH CR/8 (SUTURE) ×2 IMPLANT
SUT SILK 2-0 18XBRD TIE 12 (SUTURE) ×1 IMPLANT
SUT SILK 3 0 (SUTURE) ×1
SUT SILK 3 0 SH CR/8 (SUTURE) ×2 IMPLANT
SUT SILK 3-0 18XBRD TIE 12 (SUTURE) ×1 IMPLANT
SUT VIC AB 2-0 SH 18 (SUTURE) IMPLANT
SUT VICRYL 0 ENDOLOOP (SUTURE) ×2 IMPLANT
SUT VICRYL 2 0 18  UND BR (SUTURE)
SUT VICRYL 2 0 18 UND BR (SUTURE) IMPLANT
SYS LAPSCP GELPORT 120MM (MISCELLANEOUS)
SYSTEM LAPSCP GELPORT 120MM (MISCELLANEOUS) IMPLANT
TAPE UMBILICAL COTTON 1/8X30 (MISCELLANEOUS) ×2 IMPLANT
TOWEL OR 17X26 10 PK STRL BLUE (TOWEL DISPOSABLE) ×4 IMPLANT
TOWEL OR NON WOVEN STRL DISP B (DISPOSABLE) ×2 IMPLANT
TRAY FOLEY CATH 14FRSI W/METER (CATHETERS) ×2 IMPLANT
TRAY LAP CHOLE (CUSTOM PROCEDURE TRAY) ×2 IMPLANT
TROCAR BLADELESS OPT 5 100 (ENDOMECHANICALS) ×2 IMPLANT
TROCAR BLADELESS OPT 5 150 (ENDOMECHANICALS) ×2 IMPLANT
TROCAR XCEL NON-BLD 11X100MML (ENDOMECHANICALS) IMPLANT
TUBING CONNECTING 10 (TUBING) IMPLANT
TUBING FILTER THERMOFLATOR (ELECTROSURGICAL) ×2 IMPLANT
TUNNELER SHEATH ON-Q 16GX12 DP (PAIN MANAGEMENT) ×2 IMPLANT
YANKAUER SUCT BULB TIP 10FT TU (MISCELLANEOUS) ×2 IMPLANT
YANKAUER SUCT BULB TIP NO VENT (SUCTIONS) ×2 IMPLANT

## 2013-04-10 NOTE — Transfer of Care (Signed)
Immediate Anesthesia Transfer of Care Note  Patient: Leslie Holmes  Procedure(s) Performed: Procedure(s): LAPAROSCOPIC assisted low anterior resection, rigid proctoscopy (N/A)  Patient Location: PACU  Anesthesia Type:General  Level of Consciousness: sedated, patient cooperative and responds to stimulation  Airway & Oxygen Therapy: Patient Spontanous Breathing and Patient connected to T-piece oxygen  Post-op Assessment: Report given to PACU RN, Post -op Vital signs reviewed and stable and Patient moving all extremities  Post vital signs: Reviewed and stable  Complications: No apparent anesthesia complications

## 2013-04-10 NOTE — H&P (View-Only) (Signed)
Subjective:     Patient ID: Leslie Holmes, female   DOB: 02/03/1946, 67 y.o.   MRN: [id number]  HPI  Leslie Holmes  05/22/1946 [id number]  Patient Care Team: Tamieka Howell, MD as PCP - General (Family Medicine) Mizael Sagar C. Aariv Medlock, MD as Consulting Physician (General Surgery) Salem F Ganem, MD as Consulting Physician (Gastroenterology)  This patient is a 67 y.o.female who presents today for surgical evaluation at the request of Dr Ganem.   Reason for visit: Newly diagnosed cancer of sigmoid colon  Pleasant elderly woman.  She comes today with her female cousin.  She underwent a screening colonoscopy.  Found to have mass in sigmoid colon.  Not able to be lifted off endoscopically.  Biopsy done.  Tattooed.  Biopsy consistent with cancer.  Sent to me for surgical resection.  Patient normally has a bowel movement twice a day.  Has returned arthritis that can walk about 20 minutes before stopping secondary to joint and back pain.  On methotrexate twice a week.  Had a tubal ligation but no other abdominal surgeries.  No history of skin infections.  No personal nor family history of GI/colon cancer, inflammatory bowel disease, irritable bowel syndrome, allergy such as Celiac Sprue, dietary/dairy problems, colitis, ulcers nor gastritis.  No recent sick contacts/gastroenteritis.  No travel outside the country.  No changes in diet.  No nausea or vomiting.  No history of obstruction.  No rectal bleeding.  Patient Active Problem List   Diagnosis Date Noted  . Cancer of sigmoid colon, 25mm 03/11/2013  . HTN (hypertension) 03/11/2013  . GERD (gastroesophageal reflux disease) 03/11/2013  . Hypercholesteremia 03/11/2013  . Obesity, Class III, BMI 40-49.9 (morbid obesity) 03/11/2013  . Rheumatoid arthritis 03/11/2013    Past Medical History  Diagnosis Date  . Cancer     sigmoid   . HTN (hypertension) 03/11/2013  . GERD (gastroesophageal reflux disease) 03/11/2013  . Hypercholesteremia 03/11/2013  .  Rheumatoid arthritis 03/11/2013    Takes Methotrexate 2x/week     Past Surgical History  Procedure Laterality Date  . Bunionectomy    . Tubal ligation    . Dilation and curettage of uterus      History   Social History  . Marital Status: Single    Spouse Name: N/A    Number of Children: N/A  . Years of Education: N/A   Occupational History  . Not on file.   Social History Main Topics  . Smoking status: Never Smoker   . Smokeless tobacco: Never Used  . Alcohol Use: Yes     Comment: rare  . Drug Use: No  . Sexually Active: Not on file   Other Topics Concern  . Not on file   Social History Narrative  . No narrative on file    Family History  Problem Relation Age of Onset  . Heart disease Mother   . Hypertension Mother     Current Outpatient Prescriptions  Medication Sig Dispense Refill  . folic acid (FOLVITE) 1 MG tablet       . lisinopril-hydrochlorothiazide (PRINZIDE,ZESTORETIC) 20-25 MG per tablet       . methotrexate (RHEUMATREX) 2.5 MG tablet       . pravastatin (PRAVACHOL) 40 MG tablet        No current facility-administered medications for this visit.     No Known Allergies  BP 204/96  Pulse 76  Resp 24  Ht 5' 2.5" (1.588 m)  Wt 241 lb 9.6 oz (109.589 kg)  BMI 43.46   kg/m2  No results found.   Review of Systems  Constitutional: Negative for fever, chills, diaphoresis, appetite change and fatigue.  HENT: Negative for ear pain, sore throat, trouble swallowing, neck pain and ear discharge.   Eyes: Negative for photophobia, discharge and visual disturbance.  Respiratory: Negative for cough, choking, chest tightness and shortness of breath.   Cardiovascular: Negative for chest pain, palpitations and leg swelling.  Gastrointestinal: Negative for nausea, vomiting, abdominal pain, diarrhea, constipation, blood in stool, abdominal distention, anal bleeding and rectal pain.  Endocrine: Negative for cold intolerance and heat intolerance.  Genitourinary:  Negative for dysuria, frequency and difficulty urinating.  Musculoskeletal: Positive for myalgias, back pain and arthralgias. Negative for gait problem.  Skin: Negative for color change, pallor and rash.  Allergic/Immunologic: Positive for immunocompromised state. Negative for environmental allergies and food allergies.  Neurological: Negative for dizziness, speech difficulty, weakness and numbness.  Hematological: Negative for adenopathy.  Psychiatric/Behavioral: Negative for confusion and agitation. The patient is not nervous/anxious.        Objective:   Physical Exam  Constitutional: She is oriented to person, place, and time. She appears well-developed and well-nourished. No distress.  HENT:  Head: Normocephalic.  Mouth/Throat: Oropharynx is clear and moist. No oropharyngeal exudate.  Eyes: Conjunctivae and EOM are normal. Pupils are equal, round, and reactive to light. No scleral icterus.  Neck: Normal range of motion. Neck supple. No tracheal deviation present.  Cardiovascular: Normal rate, regular rhythm and intact distal pulses.   Pulmonary/Chest: Effort normal and breath sounds normal. No stridor. No respiratory distress. She exhibits no tenderness.  Abdominal: Soft. She exhibits no distension and no mass. There is no tenderness. No hernia. Hernia confirmed negative in the right inguinal area and confirmed negative in the left inguinal area.  Obese with moderate panniculus.  Apple -like central obesity  Genitourinary: No vaginal discharge found.  Musculoskeletal: Normal range of motion. She exhibits no tenderness.       Right elbow: She exhibits normal range of motion.       Left elbow: She exhibits normal range of motion.       Right wrist: She exhibits normal range of motion.       Left wrist: She exhibits normal range of motion.       Right hand: Normal strength noted.       Left hand: Normal strength noted.  Lymphadenopathy:       Head (right side): No posterior auricular  adenopathy present.       Head (left side): No posterior auricular adenopathy present.    She has no cervical adenopathy.    She has no axillary adenopathy.       Right: No inguinal adenopathy present.       Left: No inguinal adenopathy present.  Neurological: She is alert and oriented to person, place, and time. No cranial nerve deficit. She exhibits normal muscle tone. Coordination normal.  Skin: Skin is warm and dry. No rash noted. She is not diaphoretic. No erythema.  Psychiatric: She has a normal mood and affect. Her behavior is normal. Judgment and thought content normal.       Assessment:     Cancer of sigmoid colon.  25 mm.     Plan:     I recommended laparoscopic segmental colonic resection:  The anatomy & physiology of the digestive tract was discussed.  The pathophysiology was discussed.  Natural history risks without surgery was discussed.   I feel the risks of no intervention   will lead to serious problems that outweigh the operative risks; therefore, I recommended a partial colectomy to remove the pathology.  Laparoscopic & open techniques were discussed.   Risks such as bleeding, infection, abscess, leak, reoperation, possible ostomy, hernia, heart attack, death, and other risks were discussed.  I noted a good likelihood this will help address the problem.   Goals of post-operative recovery were discussed as well.  We will work to minimize complications.  An educational handout on the pathology was given as well.  Questions were answered.  The patient & her cousin express understanding & wish to proceed with surgery.  We will obtain CEA and CT of chest/abdomen/pelvis for proper staging.  Hold off on CXR since CT scan being done.       

## 2013-04-10 NOTE — Op Note (Signed)
04/10/2013  11:20 AM  PATIENT:  Leslie Holmes  67 y.o. female  Patient Care Team: Devra Dopp, MD as PCP - General (Family Medicine) Ardeth Sportsman, MD as Consulting Physician (General Surgery) Graylin Shiver, MD as Consulting Physician (Gastroenterology)  PRE-OPERATIVE DIAGNOSIS:  sigmoid colon cancer   POST-OPERATIVE DIAGNOSIS:  rectal cancer  PROCEDURE:  Procedure(s): LAPAROSCOPIC assisted low anterior resection rigid proctoscopy  SURGEON:  Surgeon(s): Ardeth Sportsman, MD Romie Levee, MD - Asst  ANESTHESIA:   local and general  EBL:  Total I/O In: 3000 [I.V.:3000] Out: 420 [Urine:270; Blood:150]  Delay start of Pharmacological VTE agent (>24hrs) due to surgical blood loss or risk of bleeding:  no  DRAINS: none   SPECIMEN:  Source of Specimen:  Rectosigmoid colon.  open end proximal.  Anastomotis rings (blue stitch proximal)  DISPOSITION OF SPECIMEN:  PATHOLOGY  COUNTS:  YES  PLAN OF CARE: Admit to inpatient   PATIENT DISPOSITION:  PACU - hemodynamically stable.  INDICATION:    Pleasant morbidly obese elderly woman.  Immunosuppressed on methotrexate.  Found to have a mass in the sigmoid colon.  Biopsy consistent with adenocarcinoma.  Area tattooed.  Patient sent to me for surgical evaluation to  I recommended segmental resection:  The anatomy & physiology of the digestive tract was discussed.  The pathophysiology was discussed.  Natural history risks without surgery was discussed.   I worked to give an overview of the disease and the frequent need to have multispecialty involvement.  I feel the risks of no intervention will lead to serious problems that outweigh the operative risks; therefore, I recommended a partial colectomy to remove the pathology.  Laparoscopic & open techniques were discussed.   Risks such as bleeding, infection, abscess, leak, reoperation, possible ostomy, hernia, heart attack, death, and other risks were discussed.  I noted a good  likelihood this will help address the problem.   Goals of post-operative recovery were discussed as well.  We will work to minimize complications.  Educational materials on the pathology had been given in the office.  Questions were answered.    The patient expressed understanding & wished to proceed with surgery.  OR FINDINGS:   Patient had posterior proximal rectal ulcer, 13cm from anal verge.  This was a rectal cancer.  Not sigmoid.  No obvious metastatic disease on visceral parietal peritoneum or liver.  The anastomosis rests 9 cm from the anal verge by rigid proctoscopy.  DESCRIPTION:   Informed consent was confirmed.  The patient underwent general anaesthesia without difficulty.  The patient was positioned appropriately.  VTE prevention in place.  The patient's abdomen was clipped, prepped, & draped in a sterile fashion.  Surgical timeout confirmed our plan.  The patient was positioned in reverse Trendelenburg.  Abdominal entry was gained using optical entry technique in the right upper abdomen.  Entry was clean.  I induced carbon dioxide insufflation.  Camera inspection revealed no injury.  Extra ports were carefully placed under direct laparoscopic visualization.  I reflected the greater omentum and the upper abdomen the small bowel in the upper abdomen. I scored the base of peritoneum of the right side of the mesentery of the left colon from the ligament of Treitz to the peritoneal rectal reflection.  I elevated the sigmoid mesentery and got into the retro-mesenteric plane. We were able to identify the left ureter and gonadal vessels. We kept those posterior within the retroperitoneum and elevated the left colon mesentery off that. I did free off  of mesentery to the IMA pedicle but did not ligate it yet.  I continued distally and got into the avascular plane posterior to the mesorectum. This allowed me to help mobilize the rectum as well by freeing the mesorectum off the sacrum.  I  mobilized the peritoneal coverings to the peritoneal reflection on both the right and left sides of the rectum.  I could see the right and left ureters and stayed away from them.  I kept in the lateral vascular pedicles to the rectum intact.  We found the tattooing in the proximal/mid rectum.  Near the peritoneal reflection.  Dr. Maisie Fus did rigid proctoscopy and confirmed that it was a posterior rectal cancer.  12-13 cm from anal verge, distal to the tattooing.  I skeletonized the lymph nodes off the inferior mesenteric artery pedicle.  I went down to its takeoff from the aorta.  I isolated the inferior mesenteric vein off of the ligament of Treitz just cephalad to that as well.  After confirming the left ureter was out of the way, I went ahead and ligated the inferior mesenteric artery pedicle with bipolar EnSeal just near its takeoff from the aorta.  I did ligate the inferior mesenteric vein in a similar fashion.  We ensured hemostasis. I skeletonized the mesorectum at the junction at the proximal rectum using blunt dissection & bipolar EnSeal.  I mobilized the left colon some more to ensure good mobilization of the left colon to reach into the pelvis.  I placed a GelPort has a wound protector through a Pfannenstiel incision in the suprapubic region, taking care to avoid bladder injury. I transected bowel at the junction between the proximal and mid rectum using a contour stapler. I was able to eviscerate the rectosigmoid and descending colon out the wound.  I chose a region at the descending/sigmoid junction that was soft and easily reached down. I clamped the colon proximal to this area using a soft bowel clamp. I transected at the descending/sigmoid junction with a scalpel. I got healthy bleeding mucosa. I transected the remaining specimen mesentery in a radial fashion to preserve good blood supply to the proximal colon end.  We sent the rectosigmoid colon specimen off to go to pathology. We sized the  colon orifice.  I chose a 33 EEA anvil stapler system. I placed the anvil to the open end of the descending colon and closed around it using a 0 Prolene pursestring.  We did copious irrigation with crystalloid solution.  Hemostasis was good.  We opened up the specimen on the back table and confirmed we had a rectal ulcer distal to the tattoo and had good distal margin.  I scrubbed down and did gentle anal dilation and advanced the EEA stapler up the rectal stump. The spike was brought out at the provimal end of the rectal stump under direct visualization.  Dr. Maisie Fus attached the anvil of the proximal colon the spike of the stapler. Anvil was tightened down and held clamped for 60 seconds. The EEA stapler was fired and held clamped for 30 seconds. The stapler was released & removed. We noted 2 excellent anastomotic rings. Blue stitch is in the proximal ring.  I did rigid proctoscopy noted the anastomosis was at 9 cm from the anal verge consistent with the proximal rectum.  We did a final irrigation of antibiotic solution (900 mg clindamycin/240 mg gentamicin in a liter of crystalloid) & held that for the pelvic air leak test .  The rectum was insufflated  the rectum while clamping the colon proximal to that anastomosis.  There was a negative air leak test. There was no tension. Anastomosis & colon looked viable.    We changed gown and gloves.  We did diagnostic laparoscopy.  We aspirated the antibiotic irrigation.  Hemostasis was good.   Ureters & bowel uninjured.  The anastomosis looked healthy.  I placed On-Q catheter and sheaths into the preperitoneal space under direct palpation.  I removed CO2 gas out through the ports.  I closed the 5mm port sites using Monocryl stitch and sterile dressing.  I closed the Pfannenstiel wound using a 0 Vicryl vertical peritoneal closure and a #1 PDS transverse anterior rectal fascial closure. I closed the skin with some interrupted Monocryl stitches. I placed antibiotic-soaked  wicks into the closure at the corners & centrally x4  between those areas. I placed a sterile dressing.  OnQ catheters placed & sheaths peeled away.  Patient is being extubated go to recovery room. I discussed postop care with the patient in detail the office & in the holding area. Instructions are written.  I'm about to locate family and discuss it with them as well.

## 2013-04-10 NOTE — Interval H&P Note (Signed)
History and Physical Interval Note:  04/10/2013 7:15 AM  Leslie Holmes  has presented today for surgery, with the diagnosis of sigmoid colon cancer   The various methods of treatment have been discussed with the patient and family. After consideration of risks, benefits and other options for treatment, the patient has consented to  Procedure(s): LAPAROSCOPIC PARTIAL COLECTOMY with rigid proctoscopy  (N/A) as a surgical intervention .  The patient's history has been reviewed, patient examined, no change in status, stable for surgery.  I have reviewed the patient's chart and labs.  Questions were answered to the patient's satisfaction.     [name]

## 2013-04-10 NOTE — Anesthesia Preprocedure Evaluation (Addendum)
Anesthesia Evaluation  Patient identified by MRN, date of birth, ID band Patient awake    Reviewed: Allergy & Precautions, H&P , NPO status , Patient's Chart, lab work & pertinent test results  Airway Mallampati: II TM Distance: >3 FB Neck ROM: Full    Dental  (+) Dental Advisory Given, Poor Dentition, Missing and Partial Upper   Pulmonary neg pulmonary ROS,  breath sounds clear to auscultation        Cardiovascular hypertension, Pt. on medications negative cardio ROS  Rhythm:Regular Rate:Normal     Neuro/Psych negative neurological ROS  negative psych ROS   GI/Hepatic Neg liver ROS, GERD-  Medicated,  Endo/Other  negative endocrine ROS  Renal/GU negative Renal ROS     Musculoskeletal  (+) Arthritis -, Rheumatoid disorders,    Abdominal (+) + obese,   Peds  Hematology negative hematology ROS (+)   Anesthesia Other Findings   Reproductive/Obstetrics negative OB ROS                        Anesthesia Physical Anesthesia Plan  ASA: II  Anesthesia Plan: General   Post-op Pain Management:    Induction: Intravenous  Airway Management Planned: Oral ETT  Additional Equipment:   Intra-op Plan:   Post-operative Plan: Extubation in OR  Informed Consent: I have reviewed the patients History and Physical, chart, labs and discussed the procedure including the risks, benefits and alternatives for the proposed anesthesia with the patient or authorized representative who has indicated his/her understanding and acceptance.   Dental advisory given  Plan Discussed with: CRNA  Anesthesia Plan Comments:        Anesthesia Quick Evaluation

## 2013-04-10 NOTE — Anesthesia Postprocedure Evaluation (Signed)
Anesthesia Post Note  Patient: Leslie Holmes  Procedure(s) Performed: Procedure(s) (LRB): LAPAROSCOPIC assisted low anterior resection, rigid proctoscopy (N/A)  Anesthesia type: General  Patient location: PACU  Post pain: Pain level controlled  Post assessment: Post-op Vital signs reviewed  Last Vitals: BP 132/76  Pulse 78  Temp(Src) 36.5 C (Oral)  Resp 20  Ht 5\' 2"  (1.575 m)  Wt 228 lb 9.9 oz (103.7 kg)  BMI 41.8 kg/m2  SpO2 96%  Post vital signs: Reviewed  Level of consciousness: sedated  Complications: No apparent anesthesia complications

## 2013-04-11 LAB — BASIC METABOLIC PANEL
Calcium: 9 mg/dL (ref 8.4–10.5)
GFR calc Af Amer: >90 mL/min (ref >90)
GFR calc non Af Amer: >90 mL/min (ref >90)
Glucose, Bld: 113 mg/dL — ABNORMAL HIGH (ref 70–99)
Potassium: 3.6 mEq/L (ref 3.5–5.1)
Sodium: 141 mEq/L (ref 135–145)

## 2013-04-11 LAB — CBC
MCH: 31 pg (ref 26.0–34.0)
MCHC: 32.8 g/dL (ref 30.0–36.0)
Platelets: 249 10*3/uL (ref 150–400)
RBC: 3.64 MIL/uL — ABNORMAL LOW (ref 3.87–5.11)

## 2013-04-11 LAB — MAGNESIUM: Magnesium: 2 mg/dL (ref 1.5–2.5)

## 2013-04-11 NOTE — Progress Notes (Signed)
General Surgery Note  LOS: 1 day  POD -  1 Day Post-Op GI - S. Ganem   Assessment/Plan: 1.  LAPAROSCOPIC assisted low anterior resection, rigid proctoscopy - S. Gross - 04/11/2013  On Entereg  Looks good   2. Hypertension 3.  GERD 4.  DVT prophylaxis - SQ Heparin 5.  Will remove foley  Subjective:  Doing well.  Some soreness.  Has walked in hall once.  Daughter is in room.  Objective:   Filed Vitals:   04/11/13 1100  BP: 147/72  Pulse: 73  Temp:   Resp: 18     Intake/Output from previous day:  06/13 0701 - 06/14 0700 In: 4387.5 [P.O.:120; I.V.:4267.5] Out: 1840 [Urine:1690; Blood:150]  Intake/Output this shift:  Total I/O In: 240 [P.O.:240] Out: 900 [Urine:900]   Physical Exam:   General: WN older AA F who is alert and oriented.    HEENT: Normal. Pupils equal. .   Lungs: Clear.  Good IS.   Abdomen: Quiet.  On Q intack.   Wound: Dressing pulled off some.  Wound otherwise okay.   Lab Results:    Recent Labs  04/11/13 0408  WBC 16.0*  HGB 11.3*  HCT 34.5*  PLT 249    BMET   Recent Labs  04/11/13 0408  NA 141  K 3.6  CL 106  CO2 28  GLUCOSE 113*  BUN 7  CREATININE 0.64  CALCIUM 9.0    PT/INR  No results found for this basename: LABPROT, INR,  in the last 72 hours  ABG  No results found for this basename: PHART, PCO2, PO2, HCO3,  in the last 72 hours   Studies/Results:  No results found.   Anti-infectives:   Anti-infectives   Start     Dose/Rate Route Frequency Ordered Stop   04/10/13 1430  cefOXitin (MEFOXIN) 1 g in dextrose 5 % 50 mL IVPB     1 g 100 mL/hr over 30 Minutes Intravenous Every 6 hours 04/10/13 1403 04/11/13 0328   04/10/13 1400  cefoTEtan (CEFOTAN) 2 g in dextrose 5 % 50 mL IVPB  Status:  Discontinued     2 g 100 mL/hr over 30 Minutes Intravenous Every 12 hours 04/10/13 1351 04/10/13 1357   04/10/13 1050  clindamycin (CLEOCIN) 900 mg, gentamicin (GARAMYCIN) 240 mg in sodium chloride 0.9 % 1,000 mL for intraperitoneal  lavage  Status:  Discontinued       As needed 04/10/13 1050 04/10/13 1124   04/10/13 0515  cefOXitin (MEFOXIN) 2 g in dextrose 5 % 50 mL IVPB     2 g 100 mL/hr over 30 Minutes Intravenous 30 min pre-op 04/10/13 0515 04/10/13 0720   04/09/13 1915  clindamycin (CLEOCIN) 900 mg, gentamicin (GARAMYCIN) 240 mg in sodium chloride 0.9 % 1,000 mL for intraperitoneal lavage  Status:  Discontinued      Intraperitoneal To Surgery 04/09/13 1904 04/10/13 1345      Ovidio Kin, MD, FACS Pager: [phone],   [facility] Office: [phone] 04/11/2013

## 2013-04-12 LAB — CBC WITH DIFFERENTIAL/PLATELET
Basophils Absolute: 0.1 10*3/uL (ref 0.0–0.1)
Lymphocytes Relative: 19 % (ref 12–46)
Neutro Abs: 8.9 10*3/uL — ABNORMAL HIGH (ref 1.7–7.7)
Platelets: 155 10*3/uL (ref 150–400)
RBC: 3.43 MIL/uL — ABNORMAL LOW (ref 3.87–5.11)
RDW: 14.9 % (ref 11.5–15.5)
WBC: 12.7 10*3/uL — ABNORMAL HIGH (ref 4.0–10.5)

## 2013-04-12 LAB — BASIC METABOLIC PANEL
Calcium: 8.9 mg/dL (ref 8.4–10.5)
Chloride: 110 mEq/L (ref 96–112)
Creatinine, Ser: 0.66 mg/dL (ref 0.50–1.10)
GFR calc Af Amer: >90 mL/min (ref >90)
Sodium: 142 mEq/L (ref 135–145)

## 2013-04-12 NOTE — Progress Notes (Signed)
General Surgery Note  LOS: 2 days  POD -  2 Days Post-Op GI - S. Ganem  Assessment/Plan: 1.  LAPAROSCOPIC assisted low anterior resection, rigid proctoscopy - S. Gross - 04/11/2013  On Entereg  Has had a small bowel movement.  Looks good.  Progressing well.   2.  Hypertension 3.  GERD 4.  DVT prophylaxis - SQ Heparin  Subjective:  Doing well.  Tolerating diet.  Had small BM.  Daughter is in room.   Objective:   Filed Vitals:   04/12/13 0515  BP: 161/84  Pulse: 63  Temp: 98.2 F (36.8 C)  Resp: 18    Intake/Output from previous day:  06/14 0701 - 06/15 0700 In: 1805.8 [P.O.:600; I.V.:1205.8] Out: 902 [Urine:900; Stool:2]  Intake/Output this shift:      Physical Exam:   General: WN older AA F who is alert and oriented.    HEENT: Normal. Pupils equal. .   Lungs: Clear.  Good IS.   Abdomen: Few BS. On Q intack.   Wound: Dressing dry.    Lab Results:     Recent Labs  04/11/13 0408 04/12/13 0350  WBC 16.0* 12.7*  HGB 11.3* 11.1*  HCT 34.5* 33.4*  PLT 249 155    BMET    Recent Labs  04/11/13 0408 04/12/13 0350  NA 141 142  K 3.6 4.1  CL 106 110  CO2 28 27  GLUCOSE 113* 104*  BUN 7 7  CREATININE 0.64 0.66  CALCIUM 9.0 8.9    PT/INR  No results found for this basename: LABPROT, INR,  in the last 72 hours  ABG  No results found for this basename: PHART, PCO2, PO2, HCO3,  in the last 72 hours   Studies/Results:  No results found.   Anti-infectives:   Anti-infectives   Start     Dose/Rate Route Frequency Ordered Stop   04/10/13 1430  cefOXitin (MEFOXIN) 1 g in dextrose 5 % 50 mL IVPB     1 g 100 mL/hr over 30 Minutes Intravenous Every 6 hours 04/10/13 1403 04/11/13 0328   04/10/13 1400  cefoTEtan (CEFOTAN) 2 g in dextrose 5 % 50 mL IVPB  Status:  Discontinued     2 g 100 mL/hr over 30 Minutes Intravenous Every 12 hours 04/10/13 1351 04/10/13 1357   04/10/13 1050  clindamycin (CLEOCIN) 900 mg, gentamicin (GARAMYCIN) 240 mg in sodium chloride 0.9  % 1,000 mL for intraperitoneal lavage  Status:  Discontinued       As needed 04/10/13 1050 04/10/13 1124   04/10/13 0515  cefOXitin (MEFOXIN) 2 g in dextrose 5 % 50 mL IVPB     2 g 100 mL/hr over 30 Minutes Intravenous 30 min pre-op 04/10/13 0515 04/10/13 0720   04/09/13 1915  clindamycin (CLEOCIN) 900 mg, gentamicin (GARAMYCIN) 240 mg in sodium chloride 0.9 % 1,000 mL for intraperitoneal lavage  Status:  Discontinued      Intraperitoneal To Surgery 04/09/13 1904 04/10/13 1345      Leslie Kin, MD, FACS Pager: [phone],   [facility] Office: [phone] 04/12/2013

## 2013-04-13 ENCOUNTER — Encounter (HOSPITAL_COMMUNITY): Payer: Self-pay | Admitting: Surgery

## 2013-04-13 MED ORDER — SODIUM CHLORIDE 0.9 % IJ SOLN: 3.0000 mL | INTRAMUSCULAR | Status: DC | PRN

## 2013-04-13 MED ORDER — SODIUM CHLORIDE 0.9 % IJ SOLN: 3.0000 mL | Freq: Two times a day (BID) | INTRAMUSCULAR | Status: DC

## 2013-04-13 NOTE — Discharge Summary (Signed)
Physician Discharge Summary  Patient ID: Leslie Holmes MRN: [id number] DOB/AGE: [birth year] 67 y.o.  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  Postoperatively, the patient was placed on an anti-ileus protocol.  The patient mobilized and advanced to a solid diet gradually.  Pain was well-controlled and transitioned off IV medications.    By the time of discharge, the patient was walking well the hallways, eating food well, having flatus.  Pain was-controlled on an oral regimen.  Based on meeting DC criteria and recovering well, I felt it was safe for the patient to be discharged home with close followup.  Instructions were discussed in detail with patient & family.  They are written as well.     Consults: None  Significant Diagnostic Studies:  Results for orders placed during the hospital encounter of 04/10/13 (from the past 72 hour(s))  BASIC METABOLIC PANEL     Status: Abnormal   Collection Time    04/11/13  4:08 AM      Result Value Range   Sodium 141  135 - 145 mEq/L   Potassium 3.6  3.5 - 5.1 mEq/L   Chloride 106  96 - 112 mEq/L   CO2 28  19 - 32 mEq/L   Glucose, Bld 113 (*) 70 - 99 mg/dL   BUN 7  6 - 23 mg/dL   Creatinine, Ser 1.19  0.50 - 1.10 mg/dL   Calcium 9.0  8.4 - 14.7 mg/dL   GFR calc non Af Amer >90  >90 mL/min   GFR calc Af Amer >90  >90 mL/min   Comment:            The eGFR has been calculated     using the CKD EPI equation.      This calculation has not been     validated in all clinical     situations.     eGFR's persistently     <90 mL/min signify     possible Chronic Kidney Disease.  CBC     Status: Abnormal   Collection Time    04/11/13  4:08 AM      Result Value Range   WBC 16.0 (*) 4.0 - 10.5 K/uL   RBC 3.64 (*) 3.87 - 5.11 MIL/uL   Hemoglobin 11.3 (*) 12.0 - 15.0 g/dL   HCT 82.9 (*) 56.2 - 13.0 %   MCV 94.8  78.0 - 100.0 fL   MCH 31.0  26.0 - 34.0 pg   MCHC 32.8  30.0 - 36.0 g/dL   RDW 86.5  78.4 - 69.6 %   Platelets 249  150 - 400 K/uL  MAGNESIUM     Status: None   Collection Time    04/11/13  4:08 AM      Result Value Range   Magnesium 2.0  1.5 - 2.5 mg/dL  BASIC METABOLIC PANEL     Status: Abnormal   Collection Time    04/12/13  3:50 AM  Result Value Range   Sodium 142  135 - 145 mEq/L   Potassium 4.1  3.5 - 5.1 mEq/L   Chloride 110  96 - 112 mEq/L   CO2 27  19 - 32 mEq/L   Glucose, Bld 104 (*) 70 - 99 mg/dL   BUN 7  6 - 23 mg/dL   Creatinine, Ser 1.19  0.50 - 1.10 mg/dL   Calcium 8.9  8.4 - 14.7 mg/dL   GFR calc non Af Amer >90  >90 mL/min   GFR calc Af Amer >90  >90 mL/min   Comment:            The eGFR has been calculated     using the CKD EPI equation.     This calculation has not been     validated in all clinical     situations.     eGFR's persistently     <90 mL/min signify     possible Chronic Kidney Disease.  CBC WITH DIFFERENTIAL     Status: Abnormal   Collection Time    04/12/13  3:50 AM      Result Value Range   WBC 12.7 (*) 4.0 - 10.5 K/uL   RBC 3.43 (*) 3.87 - 5.11 MIL/uL   Hemoglobin 11.1 (*) 12.0 - 15.0 g/dL   HCT 82.9 (*) 56.2 - 13.0 %   MCV 97.4  78.0 - 100.0 fL   MCH 32.4  26.0 - 34.0 pg   MCHC 33.2  30.0 - 36.0 g/dL   RDW 86.5  78.4 - 69.6 %   Platelets 155  150 - 400 K/uL   Comment: DELTA CHECK NOTED     REPEATED TO VERIFY   Neutrophils Relative % 70  43 - 77 %   Neutro Abs 8.9 (*) 1.7 - 7.7 K/uL   Lymphocytes Relative 19  12 - 46 %   Lymphs  Abs 2.5  0.7 - 4.0 K/uL   Monocytes Relative 9  3 - 12 %   Monocytes Absolute 1.1 (*) 0.1 - 1.0 K/uL   Eosinophils Relative 1  0 - 5 %   Eosinophils Absolute 0.2  0.0 - 0.7 K/uL   Basophils Relative 1  0 - 1 %   Basophils Absolute 0.1  0.0 - 0.1 K/uL    Treatments: surgery:   POST-OPERATIVE DIAGNOSIS: rectal cancer   PROCEDURE:  LAPAROSCOPIC assisted low anterior resection  rigid proctoscopy   SURGEON: Surgeon(s):  Ardeth Sportsman, MD  Romie Levee, MD - Asst   Discharge Exam: Blood pressure 149/66, pulse 70, temperature 99.1 F (37.3 C), temperature source Oral, resp. rate 18, height 5\' 2"  (1.575 m), weight 225 lb 5 oz (102.2 kg), SpO2 100.00%.  General: Pt awake/alert/oriented x4 in no major acute distress Eyes: PERRL, normal EOM. Sclera nonicteric Neuro: CN II-XII intact w/o focal sensory/motor deficits. Lymph: No head/neck/groin lymphadenopathy Psych:  No delerium/psychosis/paranoia HENT: Normocephalic, Mucus membranes moist.  No thrush Neck: Supple, No tracheal deviation Chest: No pain.  Good respiratory excursion. CV:  Pulses intact.  Regular rhythm MS: Normal AROM mjr joints.  No obvious deformity Abdomen: Soft, Nondistended.  Nontender.  No incarcerated hernias.  Inicisions clean.  OnQ, dressings, and wicks removed x4. Ext:  SCDs BLE.  No significant edema.  No cyanosis Skin: No petechiae / purpura   Disposition: Final discharge disposition not confirmed  Discharge Orders   Future Appointments Provider Department Dept Phone   04/28/2013 10:00 AM Ardeth Sportsman, MD [facility] [phone]   Future Orders Complete By Expires     Call MD for:  extreme fatigue  As directed     Call MD for:  hives  As directed     Call MD for:  persistant nausea and vomiting  As directed     Call MD for:  redness, tenderness, or signs of infection (pain, swelling, redness, odor or green/yellow discharge around incision site)  As directed     Call MD for:  severe  uncontrolled pain  As directed     Call MD for:  As directed     Comments:      Temperature > 101.93F    Diet - low sodium heart healthy  As directed     Discharge instructions  As directed     Comments:      Please see discharge instruction sheets.  Also refer to handout given an office.  Please call our office if you have any questions or concerns [phone]    Discharge wound care:  As directed     Comments:      If you have closed incisions, shower and bathe over these incisions with soap and water every day.  Remove all surgical dressings on postoperative day #3.  You do not need to replace dressings over the closed incisions unless you feel more comfortable with a Band-Aid covering it.   If you have an open wound that requires packing, please see wound care instructions.  In general, remove all dressings, wash wound with soap and water and then replace with saline moistened gauze.  Do the dressing change at least every day.  Please call our office [phone] if you have further questions.    Driving Restrictions  As directed     Comments:      No driving until off narcotics and can safely swerve away without pain during an emergency    Increase activity slowly  As directed     Comments:      Walk an hour a day.  Use 20-30 minute walks.  When you can walk 30 minutes without difficulty, increase to low impact/moderate activities such as biking, jogging, swimming, sexual activity..  Eventually can increase to unrestricted activity when not feeling pain.  If you feel pain: STOP!Marland Kitchen   Let pain protect you from overdoing it.  Use ice/heat/over-the-counter pain medications to help minimize his soreness.  Use pain prescriptions as needed to remain active.  It is better to take extra pain medications and be more active than to stay bedridden to avoid all pain medications.    Lifting restrictions  As directed     Comments:      Avoid heavy lifting initially.  Do not push through pain.  You have  no specific weight limit.  Coughing and sneezing or four more stressful to your incision than any lifting you will do. Pain will protect you from injury.  Therefore, avoid intense activity until off all narcotic pain medications.  Coughing and sneezing or four more stressful to your incision than any lifting he will do.    May shower / Bathe  As directed     May walk up steps  As directed     Sexual Activity Restrictions  As directed     Comments:      Sexual activity as tolerated.  Do not push through pain.  Pain will protect you from injury.    Walk with assistance  As directed  Comments:      Walk over an hour a day.  May use a walker/cane/companion to help with balance and stamina.        Medication List    TAKE these medications       calcium carbonate 600 MG Tabs  Commonly known as:  OS-CAL  Take 600 mg by mouth daily.     folic acid 1 MG tablet  Commonly known as:  FOLVITE  Take 1 mg by mouth daily.     lisinopril-hydrochlorothiazide 20-25 MG per tablet  Commonly known as:  PRINZIDE,ZESTORETIC  Take 1 tablet by mouth at bedtime.     methotrexate 2.5 MG tablet  Commonly known as:  RHEUMATREX  10 mg 2 (two) times a week.     multivitamin with minerals Tabs  Take 1 tablet by mouth daily.     oxyCODONE 5 MG immediate release tablet  Commonly known as:  Oxy IR/ROXICODONE  Take 1-2 tablets (5-10 mg total) by mouth every 4 (four) hours as needed for pain.     oxyCODONE 5 MG immediate release tablet  Commonly known as:  Oxy IR/ROXICODONE  Take 1-2 tablets (5-10 mg total) by mouth every 4 (four) hours as needed for pain.     pravastatin 40 MG tablet  Commonly known as:  PRAVACHOL  Take 40 mg by mouth at bedtime.           Follow-up Information   Follow up with Katrece Roediger C., MD. Schedule an appointment as soon as possible for a visit in 3 weeks.   Contact information:   [address] [place] [address] [phone]        Signed: Ardeth Sportsman. 04/13/2013, 8:19 AM

## 2013-04-14 ENCOUNTER — Encounter (INDEPENDENT_AMBULATORY_CARE_PROVIDER_SITE_OTHER): Payer: Self-pay | Admitting: Surgery

## 2013-04-14 ENCOUNTER — Telehealth (INDEPENDENT_AMBULATORY_CARE_PROVIDER_SITE_OTHER): Payer: Self-pay

## 2013-04-14 NOTE — Telephone Encounter (Signed)
Called pt to notify her of the path report showing good news no more residual cancer in the specimen just the cancer in the polyp that was already removed per Dr Michaell Cowing. I advised pt that this is an early stage rectal cancer good chance of cure with surgery only per Dr Michaell Cowing. I did add pt onto the GI cancer conference.

## 2013-04-14 NOTE — Progress Notes (Signed)
Quick Note:  No more residual cancer in specimen. Just high grade dysplasia. With cancer only in the removed polyp, this implies an early, Stage I rectal cancer. Good chance of cure >90% with surgery only. Please give patient the result. ______

## 2013-04-28 ENCOUNTER — Ambulatory Visit (INDEPENDENT_AMBULATORY_CARE_PROVIDER_SITE_OTHER): Payer: Medicare Other | Admitting: Surgery

## 2013-04-28 ENCOUNTER — Encounter (INDEPENDENT_AMBULATORY_CARE_PROVIDER_SITE_OTHER): Payer: Self-pay | Admitting: Surgery

## 2013-04-28 VITALS — BP 114/78 | HR 74 | Temp 96.8°F | Ht 63.0 in | Wt 207.2 lb

## 2013-04-28 DIAGNOSIS — C20 Malignant neoplasm of rectum: Secondary | ICD-10-CM

## 2013-04-28 NOTE — Progress Notes (Signed)
Subjective:     Patient ID: Leslie Holmes, female   DOB: Dec 02, 1945, 67 y.o.   MRN: [id number]  HPI  Randi Poullard  July 02, 1946 [id number]  Patient Care Team: Devra Dopp, MD as PCP - General (Family Medicine) Ardeth Sportsman, MD as Consulting Physician (General Surgery) Graylin Shiver, MD as Consulting Physician (Gastroenterology)  This patient is a 67 y.o.female who presents today for surgical evaluation s/p surgery 04/10/2013  POST-OPERATIVE DIAGNOSIS: rectal cancer   PROCEDURE: Procedure(s):  LAPAROSCOPIC assisted low anterior resection  rigid proctoscopy  SURGEON: Surgeon(s):  Ardeth Sportsman, MD  Romie Levee, MD - Asst   Diagnosis Colon, segmental resection for tumor, rectosigmoid - HIGH GRADE GLANDULAR DYSPLASIA. - NO RESIDUAL INVASIVE CARCINOMA. - MARGINS NOT INVOLVED. - THIRTEEN BENIGN LYMPH NODES (0/13).  The patient comes in today feeling well.  Off pain meds.  Regular bowel movements.  No rectal bleeding.  Energy level coming back well.  Denies any drainage or pain from the incision.  No fevers chills or sweats.  No drainage.  In good spirits.    Patient Active Problem List   Diagnosis Date Noted  . 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 HTN (hypertension) 03/11/2013  . GERD (gastroesophageal reflux disease) 03/11/2013  . Hypercholesteremia 03/11/2013  . Obesity, Class III, BMI 40-49.9 (morbid obesity) 03/11/2013  . Rheumatoid arthritis(714.0) 03/11/2013    Past Medical History  Diagnosis Date  . Cancer     sigmoid   . HTN (hypertension) 03/11/2013  . GERD (gastroesophageal reflux disease) 03/11/2013  . Hypercholesteremia 03/11/2013  . Rheumatoid arthritis(714.0) 03/11/2013    Takes Methotrexate 2x/week     Past Surgical History  Procedure Laterality Date  . Bunionectomy    . Tubal ligation    . Dilation and curettage of uterus    . Colonoscopy w/ biopsies    . Laparoscopic partial colectomy N/A 04/10/2013   Procedure: LAPAROSCOPIC assisted low anterior resection, rigid proctoscopy;  Surgeon: Ardeth Sportsman, MD;  Location: [facility];  Service: General;  Laterality: N/A;    History   Social History  . Marital Status: Single    Spouse Name: N/A    Number of Children: N/A  . Years of Education: N/A   Occupational History  . Not on file.   Social History Main Topics  . Smoking status: Never Smoker   . Smokeless tobacco: Never Used  . Alcohol Use: Yes     Comment: rare  . Drug Use: No  . Sexually Active: Not on file   Other Topics Concern  . Not on file   Social History Narrative  . No narrative on file    Family History  Problem Relation Age of Onset  . Heart disease Mother   . Hypertension Mother     Current Outpatient Prescriptions  Medication Sig Dispense Refill  . calcium carbonate (OS-CAL) 600 MG TABS Take 600 mg by mouth daily.      . folic acid (FOLVITE) 1 MG tablet Take 1 mg by mouth daily.       Marland Kitchen lisinopril-hydrochlorothiazide (PRINZIDE,ZESTORETIC) 20-25 MG per tablet Take 1 tablet by mouth at bedtime.       . methotrexate (RHEUMATREX) 2.5 MG tablet 10 mg 2 (two) times a week.       . Multiple Vitamin (MULTIVITAMIN WITH MINERALS) TABS Take 1 tablet by mouth daily.      . pravastatin (PRAVACHOL) 40 MG tablet Take 40 mg by mouth at bedtime.  No current facility-administered medications for this visit.     No Known Allergies  BP 114/78  Pulse 74  Temp(Src) 96.8 F (36 C) (Temporal)  Ht 5\' 3"  (1.6 m)  Wt 207 lb 3.2 oz (93.985 kg)  BMI 36.71 kg/m2  SpO2 97%  No results found.   Review of Systems  Constitutional: Negative for fever, chills and diaphoresis.  HENT: Negative for ear pain, sore throat and trouble swallowing.   Eyes: Negative for photophobia and visual disturbance.  Respiratory: Negative for cough and choking.   Cardiovascular: Negative for chest pain and palpitations.  Gastrointestinal: Negative for nausea, vomiting, abdominal pain,  diarrhea, constipation, anal bleeding and rectal pain.  Genitourinary: Negative for dysuria, frequency and difficulty urinating.  Musculoskeletal: Negative for myalgias and gait problem.  Skin: Negative for color change, pallor and rash.  Neurological: Negative for dizziness, speech difficulty, weakness and numbness.  Hematological: Negative for adenopathy.  Psychiatric/Behavioral: Negative for confusion and agitation. The patient is not nervous/anxious.        Objective:   Physical Exam  Constitutional: She is oriented to person, place, and time. She appears well-developed and well-nourished. No distress.  HENT:  Head: Normocephalic.  Mouth/Throat: Oropharynx is clear and moist. No oropharyngeal exudate.  Eyes: Conjunctivae and EOM are normal. Pupils are equal, round, and reactive to light. No scleral icterus.  Neck: Normal range of motion. No tracheal deviation present.  Cardiovascular: Normal rate and intact distal pulses.   Pulmonary/Chest: Effort normal. No respiratory distress. She exhibits no tenderness.  Abdominal: Soft. She exhibits no distension. There is no tenderness. Hernia confirmed negative in the right inguinal area and confirmed negative in the left inguinal area.  Incisions clean with normal healing ridges.  No hernias  Genitourinary: No vaginal discharge found.  Musculoskeletal: Normal range of motion. She exhibits no tenderness.  Lymphadenopathy:       Right: No inguinal adenopathy present.       Left: No inguinal adenopathy present.  Neurological: She is alert and oriented to person, place, and time. No cranial nerve deficit. She exhibits normal muscle tone. Coordination normal.  Skin: Skin is warm and dry. No rash noted. She is not diaphoretic.  Psychiatric: She has a normal mood and affect. Her behavior is normal.       Assessment:     Early stage rectal cancer within a polyp.  No residual cancer and negative lymph nodes.  pT1sm1N0     Plan:     I again  discussed the pathology with her.  She has a copy.  We discussed this with pathology (Dr. [place] Rund and Dr. Jimmy Picket) And at our GI tumor conference.  I am glad this looks like an early stage I cancer.  I strongly recommend she get a followup colonoscopy in one year to make sure she does not grow any new polyps/cancer.  She agrees.  Increase activity as tolerated to regular activity.  Low impact exercise such as walking an hour a day at least ideal.  Do not push through pain.  Diet as tolerated.  Low fat high fiber diet ideal.  Bowel regimen with 30 g fiber a day and fiber supplement as needed to avoid problems.  Since she is doing remarkably well so far, okay to return to clinic as needed.   Instructions discussed.  Followup with primary care physician for other health issues as would normally be done.  Questions answered.  The patient expressed understanding and appreciation

## 2013-04-28 NOTE — Patient Instructions (Addendum)
GETTING TO GOOD BOWEL HEALTH. Irregular bowel habits such as constipation and diarrhea can lead to many problems over time.  Having one soft bowel movement a day is the most important way to prevent further problems.  The anorectal canal is designed to handle stretching and feces to safely manage our ability to get rid of solid waste (feces, poop, stool) out of our body.  BUT, hard constipated stools can act like ripping concrete bricks and diarrhea can be a burning fire to this very sensitive area of our body, causing inflamed hemorrhoids, anal fissures, increasing risk is perirectal abscesses, abdominal pain/bloating, an making irritable bowel worse.     The goal: ONE SOFT BOWEL MOVEMENT A DAY!  To have soft, regular bowel movements:    Drink at least 8 tall glasses of water a day.     Take plenty of fiber.  Fiber is the undigested part of plant food that passes into the colon, acting s "natures broom" to encourage bowel motility and movement.  Fiber can absorb and hold large amounts of water. This results in a larger, bulkier stool, which is soft and easier to pass. Work gradually over several weeks up to 6 servings a day of fiber (25g a day even more if needed) in the form of: o Vegetables -- Root (potatoes, carrots, turnips), leafy green (lettuce, salad greens, celery, spinach), or cooked high residue (cabbage, broccoli, etc) o Fruit -- Fresh (unpeeled skin & pulp), Dried (prunes, apricots, cherries, etc ),  or stewed ( applesauce)  o Whole grain breads, pasta, etc (whole wheat)  o Bran cereals    Bulking Agents -- This type of water-retaining fiber generally is easily obtained each day by one of the following:  o Psyllium bran -- The psyllium plant is remarkable because its ground seeds can retain so much water. This product is available as Metamucil, Konsyl, Effersyllium, Per Diem Fiber, or the less expensive generic preparation in drug and health food stores. Although labeled a laxative, it really  is not a laxative.  o Methylcellulose -- This is another fiber derived from wood which also retains water. It is available as Citrucel. o Polyethylene Glycol - and "artificial" fiber commonly called Miralax or Glycolax.  It is helpful for people with gassy or bloated feelings with regular fiber o Flax Seed - a less gassy fiber than psyllium   No reading or other relaxing activity while on the toilet. If bowel movements take longer than 5 minutes, you are too constipated   AVOID CONSTIPATION.  High fiber and water intake usually takes care of this.  Sometimes a laxative is needed to stimulate more frequent bowel movements, but    Laxatives are not a good long-term solution as it can wear the colon out. o Osmotics (Milk of Magnesia, Fleets phosphosoda, Magnesium citrate, MiraLax, GoLytely) are safer than  o Stimulants (Senokot, Castor Oil, Dulcolax, Ex Lax)    o Do not take laxatives for more than 7days in a row.    IF SEVERELY CONSTIPATED, try a Bowel Retraining Program: o Do not use laxatives.  o Eat a diet high in roughage, such as bran cereals and leafy vegetables.  o Drink six (6) ounces of prune or apricot juice each morning.  o Eat two (2) large servings of stewed fruit each day.  o Take one (1) heaping tablespoon of a psyllium-based bulking agent twice a day. Use sugar-free sweetener when possible to avoid excessive calories.  o Eat a normal breakfast.  o   Set aside 15 minutes after breakfast to sit on the toilet, but do not strain to have a bowel movement.  o If you do not have a bowel movement by the third day, use an enema and repeat the above steps.    Controlling diarrhea o Switch to liquids and simpler foods for a few days to avoid stressing your intestines further. o Avoid dairy products (especially milk & ice cream) for a short time.  The intestines often can lose the ability to digest lactose when stressed. o Avoid foods that cause gassiness or bloating.  Typical foods include  beans and other legumes, cabbage, broccoli, and dairy foods.  Every person has some sensitivity to other foods, so listen to our body and avoid those foods that trigger problems for you. o Adding fiber (Citrucel, Metamucil, psyllium, Miralax) gradually can help thicken stools by absorbing excess fluid and retrain the intestines to act more normally.  Slowly increase the dose over a few weeks.  Too much fiber too soon can backfire and cause cramping & bloating. o Probiotics (such as active yogurt, Align, etc) may help repopulate the intestines and colon with normal bacteria and calm down a sensitive digestive tract.  Most studies show it to be of mild help, though, and such products can be costly. o Medicines:   Bismuth subsalicylate (ex. Kayopectate, Pepto Bismol) every 30 minutes for up to 6 doses can help control diarrhea.  Avoid if pregnant.   Loperamide (Immodium) can slow down diarrhea.  Start with two tablets (4mg total) first and then try one tablet every 6 hours.  Avoid if you are having fevers or severe pain.  If you are not better or start feeling worse, stop all medicines and call your doctor for advice o Call your doctor if you are getting worse or not better.  Sometimes further testing (cultures, endoscopy, X-ray studies, bloodwork, etc) may be needed to help diagnose and treat the cause of the diarrhea.  Colorectal Cancer Colorectal cancer is an abnormal growth of tissue (tumor) in the colon or rectum that is cancerous (malignant). Unlike noncancerous (benign) tumors, malignant tumors can spread to other parts of your body. The colon is the large bowel or large intestine. The rectum is the last several inches of the colon. CAUSES  The exact cause of colon cancer is unknown.  RISK FACTORS The majority of patients do not have identifiable risk factors, but the following factors may increase your chances of getting colon cancer:  Age. Most colorectal cancers occur in people older than 50  years.  Having abnormal growths (polyps) on the inner wall of the colon or rectum.  Diabetes.  Being African American.  Family history of hereditary nonpolyposis colon cancer. This condition is caused by changes in the genes that are responsible for repairing mismatched DNA.  Family history of familial adenomatous polyposis (FAP). This is a rare, inherited condition in which hundreds of polyps form in the colon and rectum. It is caused by a change in the APC gene. Unless FAP is treated, it usually leads to colorectal cancer by age 40.  Personal history of cancer. A person who has already had colorectal cancer may develop it a second time. Also, women with a history of ovarian, uterine, or breast cancer are at a somewhat higher risk of developing colorectal cancer.  Inflammatory bowel disease, including ulcerative colitis and Crohn's disease.  Being obese or eating a diet that is high in fat (especially animal fat) and low in fiber,   fruits, and vegetables.  Smoking. A person who smokes cigarettes may be at increased risk of developing polyps and colorectal cancer.  Heavy alcohol use. SYMPTOMS Early colorectal cancer often does not cause symptoms. As the cancer grows, symptoms may include:  Diarrhea.  Constipation.  Feeling like the bowel does not empty completely after a bowel movement.  Blood in the stool.  Stools that are narrower than usual.  Abdominal discomfort, pain, bloating, fullness, or cramps.  Unexplained weight loss.  Constant tiredness.  Nausea and vomiting. DIAGNOSIS  Your caregiver will ask about your medical history. He or she may also perform a number of procedures, such as:  A physical exam.  Blood tests. This may include a routine complete blood count and iron level testing. Your caregiver may also check for carcinoembryonic antigen (CEA) and other substances in the blood. Some people who have colorectal cancer have a high CEA level. These levels may be  used to follow the activity of your colon cancer.  Chest X-rays, computed tomography (CT) scans, or magnetic resonance imaging (MRI).  Taking a tissue sample (biopsy) from the colon or rectum. The sample is examined under a microscope to look for cancer cells.  Sigmoidoscopy. With this test, your caregiver can see inside your colon. A thin flexible tube (sigmoidoscope) is placed into your rectum. This device has a light source and a tiny video camera in it. Your caregiver uses the sigmoidoscope to look at the last third of your colon.  Colonoscopy. This test is like sigmoidoscopy, but your caregiver looks at the entire colon. This test usually requires medicine that helps you relax (sedative).  Endorectal ultrasound. With this test, your caregiver can see how deep a rectal tumor has grown and whether the cancer has spread to lymph nodes or other nearby tissues. A tool (probe) is inserted into the rectum. The probe sends out sound waves to the rectum and nearby tissues, and a computer uses the echoes to create a picture. Your cancer will be staged to determine its severity and extent. Staging is a careful attempt to find out the size of the tumor, whether the cancer has spread, and if so, to what parts of the body. You may need to have more tests to determine the stage of your cancer. The test results will help determine what treatment plan is best for you. STAGES  Stage 0. The cancer is found only in the innermost lining of the colon or rectum.  Stage I. The cancer has grown into the inner wall of the colon or rectum. The cancer has not yet reached the outer wall of the colon.  Stage II. The cancer extends more deeply into or through the wall of the colon or rectum. It may have invaded nearby tissue, but cancer cells have not spread to the lymph nodes.  Stage III. The cancer has spread to nearby lymph nodes but not to other parts of the body.  Stage IV. The cancer has spread to other parts of  the body, such as the liver or lungs. Your caregiver may tell you the detailed stage of your cancer. In that case, the stage will include both a number and a letter. TREATMENT  Depending on the type and stage, colorectal cancer may be treated with surgery, radiation therapy, or chemotherapy. Some patients have a combination of these therapies.  Surgery may be done to remove the polyps from your colon. In early stages, your caregiver may be able to do this during a colonoscopy.   In later stages, surgery may be done to remove part of your colon.  Radiation therapy uses high-energy rays to kill cancer cells. This is usually recommended for patients with rectal cancer.  Chemotherapy is the use of drugs to kill cancer cells. Caregivers also give chemotherapy to help reduce pain and other problems caused by colorectal cancer. This may be done even if the cancer is not curable. HOME CARE INSTRUCTIONS   Only take over-the-counter or prescription medicines for pain, discomfort, or fever as directed by your caregiver.  Maintain a healthy diet.  Consider joining a support group. This may help you learn to cope with the stress of having colorectal cancer.  Seek advice to help you manage treatment side effects.  Keep all follow-up appointments as directed by your caregiver.  Inform your cancer specialist if you are admitted to the hospital. SEEK IMMEDIATE MEDICAL CARE IF:   Your diarrhea or constipation does not go away.  You have alternating constipation and diarrhea.  You have blood in your stools.  Your abdominal pain gets worse.  You lose weight without trying.  You notice new fatigue or weakness.  You develop a fever during chemotherapy treatment. Document Released: 10/15/2005 Document Revised: 01/07/2012 Document Reviewed: 10/02/2011 [employer] Patient Information 2014 [employer].  

## 2013-05-28 ENCOUNTER — Encounter (INDEPENDENT_AMBULATORY_CARE_PROVIDER_SITE_OTHER): Payer: Medicare Other | Admitting: Surgery

## 2013-06-17 ENCOUNTER — Encounter (INDEPENDENT_AMBULATORY_CARE_PROVIDER_SITE_OTHER): Payer: Medicare Other | Admitting: Surgery

## 2013-08-11 ENCOUNTER — Other Ambulatory Visit: Payer: Self-pay

## 2013-08-11 DIAGNOSIS — Z1231 Encounter for screening mammogram for malignant neoplasm of breast: Secondary | ICD-10-CM

## 2013-09-08 ENCOUNTER — Ambulatory Visit
Admission: RE | Admit: 2013-09-08 | Discharge: 2013-09-08 | Disposition: A | Discharge status: Home / Self Care | Payer: Medicare Other | Source: Ambulatory Visit

## 2013-09-08 DIAGNOSIS — Z1231 Encounter for screening mammogram for malignant neoplasm of breast: Secondary | ICD-10-CM

## 2014-03-03 ENCOUNTER — Encounter (HOSPITAL_COMMUNITY): Payer: Self-pay | Admitting: Emergency Medicine

## 2014-03-03 ENCOUNTER — Emergency Department (INDEPENDENT_AMBULATORY_CARE_PROVIDER_SITE_OTHER)
Admission: EM | Admit: 2014-03-03 | Discharge: 2014-03-03 | Disposition: A | Discharge status: Home / Self Care | Payer: Medicare Other | Source: Home / Self Care | Attending: Family Medicine | Admitting: Family Medicine

## 2014-03-03 DIAGNOSIS — H8110 Benign paroxysmal vertigo, unspecified ear: Secondary | ICD-10-CM

## 2014-03-03 MED ORDER — MECLIZINE HCL 12.5 MG PO TABS: 12.5000 mg | ORAL_TABLET | Freq: Three times a day (TID) | ORAL | Status: AC | PRN

## 2014-03-03 NOTE — Discharge Instructions (Signed)
Thank you for coming in today. Follow up with balance physical therapy. Call [phone] to schedule an appointment.  Take meclizine every 8 hours as needed.  Come back as needed.    Benign Positional Vertigo Vertigo means you feel like you or your surroundings are moving when they are not. Benign positional vertigo is the most common form of vertigo. Benign means that the cause of your condition is not serious. Benign positional vertigo is more common in older adults. CAUSES  Benign positional vertigo is the result of an upset in the labyrinth system. This is an area in the middle ear that helps control your balance. This may be caused by a viral infection, head injury, or repetitive motion. However, often no specific cause is found. SYMPTOMS  Symptoms of benign positional vertigo occur when you move your head or eyes in different directions. Some of the symptoms may include:  Loss of balance and falls.  Vomiting.  Blurred vision.  Dizziness.  Nausea.  Involuntary eye movements (nystagmus). DIAGNOSIS  Benign positional vertigo is usually diagnosed by physical exam. If the specific cause of your benign positional vertigo is unknown, your caregiver may perform imaging tests, such as magnetic resonance imaging (MRI) or computed tomography (CT). TREATMENT  Your caregiver may recommend movements or procedures to correct the benign positional vertigo. Medicines such as meclizine, benzodiazepines, and medicines for nausea may be used to treat your symptoms. In rare cases, if your symptoms are caused by certain conditions that affect the inner ear, you may need surgery. HOME CARE INSTRUCTIONS   Follow your caregiver's instructions.  Move slowly. Do not make sudden body or head movements.  Avoid driving.  Avoid operating heavy machinery.  Avoid performing any tasks that would be dangerous to you or others during a vertigo episode.  Drink enough fluids to keep your urine clear or pale  yellow. SEEK IMMEDIATE MEDICAL CARE IF:   You develop problems with walking, weakness, numbness, or using your arms, hands, or legs.  You have difficulty speaking.  You develop severe headaches.  Your nausea or vomiting continues or gets worse.  You develop visual changes.  Your family or friends notice any behavioral changes.  Your condition gets worse.  You have a fever.  You develop a stiff neck or sensitivity to light. MAKE SURE YOU:   Understand these instructions.  Will watch your condition.  Will get help right away if you are not doing well or get worse. Document Released: 07/23/2006 Document Revised: 01/07/2012 Document Reviewed: 07/05/2011 [facility] Patient Information 2014 [place].

## 2014-03-03 NOTE — ED Notes (Signed)
Patient describes dizziness Monday morning.  Incidents come and go, seem to be worsened by change in position.  Tuesday had episodes of nausea and vomiting-2 episodes.  Today continues to complain of dizziness, room is not spinning.  Looking up triggers sense of being off balance.

## 2014-03-03 NOTE — ED Provider Notes (Signed)
Leslie Holmes is a 68 y.o. female who presents to Urgent Care today for dizziness. Patient has had 2 days of vertigo. She describes a unbalanced and spinning sensation. The vertigo lasts for about 30 seconds and is dependent upon head position and motion. She denies any weakness or numbness loss of function bilaterally dysfunction or difficulty walking. She denies any slurred speech or trouble swallowing. She has not tried any medications. She has had a few episodes of vomiting secondary to the vertigo. She denies any headache or vision changes. She has not had anything like this happen before. No tinnitus or decreased hearing. No chest pain or palpitations.   Past Medical History  Diagnosis Date  . Cancer     sigmoid   . HTN (hypertension) 03/11/2013  . GERD (gastroesophageal reflux disease) 03/11/2013  . Hypercholesteremia 03/11/2013  . Rheumatoid arthritis(714.0) 03/11/2013    Takes Methotrexate 2x/week    History  Substance Use Topics  . Smoking status: Never Smoker   . Smokeless tobacco: Never Used  . Alcohol Use: Yes     Comment: rare   ROS as above Medications: No current facility-administered medications for this encounter.   Current Outpatient Prescriptions  Medication Sig Dispense Refill  . calcium carbonate (OS-CAL) 600 MG TABS Take 600 mg by mouth daily.      . folic acid (FOLVITE) 1 MG tablet Take 1 mg by mouth daily.       Marland Kitchen lisinopril-hydrochlorothiazide (PRINZIDE,ZESTORETIC) 20-25 MG per tablet Take 1 tablet by mouth at bedtime.       . methotrexate (RHEUMATREX) 2.5 MG tablet 10 mg 2 (two) times a week.       . Multiple Vitamin (MULTIVITAMIN WITH MINERALS) TABS Take 1 tablet by mouth daily.      . pravastatin (PRAVACHOL) 40 MG tablet Take 40 mg by mouth at bedtime.         Exam:  BP 151/65  Pulse 72  Temp(Src) 98.7 F (37.1 C) (Oral)  Resp 18  SpO2 100% Filed Vitals:   03/03/14 0841 03/03/14 0846 03/03/14 0847 03/03/14 0848  BP: 145/64 139/56 154/65 151/65   Pulse: 70 71 73 72  Temp: 98.7 F (37.1 C)     TempSrc: Oral     Resp: 18     SpO2: 100%       Gen: Well NAD HEENT: EOMI,  MMM, PERRLA normal tympanic membranes Lungs: Normal work of breathing. CTABL Heart: RRR no MRG Abd: NABS, Soft. NT, ND Exts: Brisk capillary refill, warm and well perfused.  Neuro: Alert and oriented cranial 2 through 12 are intact normal balance gait coordination strength and reflexes. Sensation is intact throughout. Positive Dix-Hallpike test on the left.  No results found for this or any previous visit (from the past 24 hour(s)). No results found.  Assessment and Plan: 68 y.o. 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 Plan for low dose meclizine and referral to balance vestibular rehabilitation.  Discussed warning signs or symptoms. Please see discharge instructions. Patient expresses understanding.    Gregor Hams, MD 03/03/14 1045

## 2014-07-01 ENCOUNTER — Encounter: Payer: Self-pay | Admitting: Surgery

## 2014-08-19 ENCOUNTER — Other Ambulatory Visit: Payer: Self-pay

## 2014-08-19 DIAGNOSIS — Z1231 Encounter for screening mammogram for malignant neoplasm of breast: Secondary | ICD-10-CM

## 2014-09-14 ENCOUNTER — Encounter (INDEPENDENT_AMBULATORY_CARE_PROVIDER_SITE_OTHER): Payer: Self-pay

## 2014-09-14 ENCOUNTER — Ambulatory Visit
Admission: RE | Admit: 2014-09-14 | Discharge: 2014-09-14 | Disposition: A | Discharge status: Home / Self Care | Payer: Medicare Other | Source: Ambulatory Visit

## 2014-09-14 DIAGNOSIS — Z1231 Encounter for screening mammogram for malignant neoplasm of breast: Secondary | ICD-10-CM

## 2015-02-28 ENCOUNTER — Emergency Department (INDEPENDENT_AMBULATORY_CARE_PROVIDER_SITE_OTHER): Payer: Medicare Other

## 2015-02-28 ENCOUNTER — Encounter (HOSPITAL_COMMUNITY): Payer: Self-pay | Admitting: *Deleted

## 2015-02-28 ENCOUNTER — Emergency Department (INDEPENDENT_AMBULATORY_CARE_PROVIDER_SITE_OTHER)
Admission: EM | Admit: 2015-02-28 | Discharge: 2015-02-28 | Disposition: A | Discharge status: Home / Self Care | Payer: Medicare Other | Source: Home / Self Care | Attending: Family Medicine | Admitting: Family Medicine

## 2015-02-28 DIAGNOSIS — J40 Bronchitis, not specified as acute or chronic: Secondary | ICD-10-CM

## 2015-02-28 MED ORDER — GUAIFENESIN-CODEINE 100-10 MG/5ML PO SOLN: 5.0000 mL | Freq: Every evening | ORAL | Status: AC | PRN

## 2015-02-28 MED ORDER — PREDNISONE 10 MG PO TABS: 30.0000 mg | ORAL_TABLET | Freq: Every day | ORAL | Status: AC

## 2015-02-28 NOTE — ED Notes (Signed)
Pt  Reports   Symptoms  Of  Cough  /   Congested   With        Symptoms            For  About  1  Month                Symptoms  Not  releived   By  [facility]  meds          Pt  Is  Speaking in  Complete  sentances     Alert  And  Oriented

## 2015-02-28 NOTE — ED Provider Notes (Signed)
Leslie Holmes is a 69 y.o. female who presents to Urgent Care today for cough. Patient has had a one-month history of intermittent cough associated with chest tightness and some wheezing. No fevers or chills vomiting or diarrhea. She also notes some facial pressure. Her symptoms are consistent with previous episodes of bronchitis. She's tried Mucinex which helps some.   Past Medical History  Diagnosis Date  . Cancer     sigmoid   . HTN (hypertension) 03/11/2013  . GERD (gastroesophageal reflux disease) 03/11/2013  . Hypercholesteremia 03/11/2013  . Rheumatoid arthritis(714.0) 03/11/2013    Takes Methotrexate 2x/week    Past Surgical History  Procedure Laterality Date  . Bunionectomy    . Tubal ligation    . Dilation and curettage of uterus    . Colonoscopy w/ biopsies    . Laparoscopic partial colectomy N/A 04/10/2013    Procedure: LAPAROSCOPIC assisted low anterior resection, rigid proctoscopy;  Surgeon: Adin Hector, MD;  Location: [facility];  Service: General;  Laterality: N/A;   History  Substance Use Topics  . Smoking status: Never Smoker   . Smokeless tobacco: Never Used  . Alcohol Use: Yes     Comment: rare   ROS as above Medications: No current facility-administered medications for this encounter.   Current Outpatient Prescriptions  Medication Sig Dispense Refill  . calcium carbonate (OS-CAL) 600 MG TABS Take 600 mg by mouth daily.    . folic acid (FOLVITE) 1 MG tablet Take 1 mg by mouth daily.     Marland Kitchen guaiFENesin-codeine 100-10 MG/5ML syrup Take 5 mLs by mouth at bedtime as needed for cough. 120 mL 0  . lisinopril-hydrochlorothiazide (PRINZIDE,ZESTORETIC) 20-25 MG per tablet Take 1 tablet by mouth at bedtime.     . meclizine (ANTIVERT) 12.5 MG tablet Take 1 tablet (12.5 mg total) by mouth 3 (three) times daily as needed for dizziness. 28 tablet 0  . methotrexate (RHEUMATREX) 2.5 MG tablet 10 mg 2 (two) times a week.     . Multiple Vitamin (MULTIVITAMIN WITH MINERALS) TABS  Take 1 tablet by mouth daily.    . pravastatin (PRAVACHOL) 40 MG tablet Take 40 mg by mouth at bedtime.     . predniSONE (DELTASONE) 10 MG tablet Take 3 tablets (30 mg total) by mouth daily. 15 tablet 0   No Known Allergies   Exam:  BP 155/89 mmHg  Pulse 71  Temp(Src) 98.6 F (37 C) (Oral)  Resp 16  SpO2 99% Gen: Well NAD HEENT: EOMI,  MMM clear nasal discharge. Posterior pharynx with cobblestoning normal tympanic membranes bilaterally. Lungs: Normal work of breathing. Crackles left lower lung Heart: RRR no MRG Abd: NABS, Soft. Nondistended, Nontender Exts: Brisk capillary refill, warm and well perfused.   No results found for this or any previous visit (from the past 24 hour(s)). Dg Chest 2 View  02/28/2015   CLINICAL DATA:  One month history of cough  EXAM: CHEST  2 VIEW  COMPARISON:  Chest CT Mar 13, 2013  FINDINGS: Lungs are clear. Heart is upper normal in size with normal pulmonary vascularity. No adenopathy. There is degenerative change in the thoracic spine.  IMPRESSION: No edema or consolidation.   Electronically Signed   By: Lowella Grip III M.D.   On: 02/28/2015 10:17    Assessment and Plan: 69 y.o. female with bronchitis. Treat with prednisone and codeine cough syrup. Return as needed.  Discussed warning signs or symptoms. Please see discharge instructions. Patient expresses understanding.  Gregor Hams, MD 02/28/15 [phone]

## 2015-02-28 NOTE — Discharge Instructions (Signed)
Thank you for coming in today. °Call or go to the emergency room if you get worse, have trouble breathing, have chest pains, or palpitations.  ° °Acute Bronchitis °Bronchitis is inflammation of the airways that extend from the windpipe into the lungs (bronchi). The inflammation often causes mucus to develop. This leads to a cough, which is the most common symptom of bronchitis.  °In acute bronchitis, the condition usually develops suddenly and goes away over time, usually in a couple weeks. Smoking, allergies, and asthma can make bronchitis worse. Repeated episodes of bronchitis may cause further lung problems.  °CAUSES °Acute bronchitis is most often caused by the same virus that causes a cold. The virus can spread from person to person (contagious) through coughing, sneezing, and touching contaminated objects. °SIGNS AND SYMPTOMS  °· Cough.   °· Fever.   °· Coughing up mucus.   °· Body aches.   °· Chest congestion.   °· Chills.   °· Shortness of breath.   °· Sore throat.   °DIAGNOSIS  °Acute bronchitis is usually diagnosed through a physical exam. Your health care provider will also ask you questions about your medical history. Tests, such as chest X-rays, are sometimes done to rule out other conditions.  °TREATMENT  °Acute bronchitis usually goes away in a couple weeks. Oftentimes, no medical treatment is necessary. Medicines are sometimes given for relief of fever or cough. Antibiotic medicines are usually not needed but may be prescribed in certain situations. In some cases, an inhaler may be recommended to help reduce shortness of breath and control the cough. A cool mist vaporizer may also be used to help thin bronchial secretions and make it easier to clear the chest.  °HOME CARE INSTRUCTIONS °· Get plenty of rest.   °· Drink enough fluids to keep your urine clear or pale yellow (unless you have a medical condition that requires fluid restriction). Increasing fluids may help thin your respiratory secretions  (sputum) and reduce chest congestion, and it will prevent dehydration.   °· Take medicines only as directed by your health care provider. °· If you were prescribed an antibiotic medicine, finish it all even if you start to feel better. °· Avoid smoking and secondhand smoke. Exposure to cigarette smoke or irritating chemicals will make bronchitis worse. If you are a smoker, consider using nicotine gum or skin patches to help control withdrawal symptoms. Quitting smoking will help your lungs heal faster.   °· Reduce the chances of another bout of acute bronchitis by washing your hands frequently, avoiding people with cold symptoms, and trying not to touch your hands to your mouth, nose, or eyes.   °· Keep all follow-up visits as directed by your health care provider.   °SEEK MEDICAL CARE IF: °Your symptoms do not improve after 1 week of treatment.  °SEEK IMMEDIATE MEDICAL CARE IF: °· You develop an increased fever or chills.   °· You have chest pain.   °· You have severe shortness of breath. °· You have bloody sputum.   °· You develop dehydration. °· You faint or repeatedly feel like you are going to pass out. °· You develop repeated vomiting. °· You develop a severe headache. °MAKE SURE YOU:  °· Understand these instructions. °· Will watch your condition. °· Will get help right away if you are not doing well or get worse. °Document Released: 11/22/2004 Document Revised: 03/01/2014 Document Reviewed: 04/07/2013 °[employer]® Patient Information ©2015 [employer]. This information is not intended to replace advice given to you by your health care provider. Make sure you discuss any questions you have with your   health care provider. ° °

## 2015-08-22 ENCOUNTER — Other Ambulatory Visit: Payer: Self-pay

## 2015-08-22 DIAGNOSIS — Z1231 Encounter for screening mammogram for malignant neoplasm of breast: Secondary | ICD-10-CM

## 2015-09-28 ENCOUNTER — Ambulatory Visit
Admission: RE | Admit: 2015-09-28 | Discharge: 2015-09-28 | Disposition: A | Discharge status: Home / Self Care | Payer: Medicare Other | Source: Ambulatory Visit

## 2015-09-28 DIAGNOSIS — Z1231 Encounter for screening mammogram for malignant neoplasm of breast: Secondary | ICD-10-CM

## 2015-12-07 ENCOUNTER — Other Ambulatory Visit: Payer: Self-pay | Admitting: Internal Medicine

## 2015-12-07 DIAGNOSIS — E2839 Other primary ovarian failure: Secondary | ICD-10-CM

## 2016-01-02 ENCOUNTER — Ambulatory Visit
Admission: RE | Admit: 2016-01-02 | Discharge: 2016-01-02 | Disposition: A | Discharge status: Home / Self Care | Payer: Medicare Other | Source: Ambulatory Visit | Attending: Internal Medicine | Admitting: Internal Medicine

## 2016-01-02 DIAGNOSIS — E2839 Other primary ovarian failure: Secondary | ICD-10-CM

## 2016-11-22 ENCOUNTER — Other Ambulatory Visit: Payer: Self-pay | Admitting: Internal Medicine

## 2016-11-22 DIAGNOSIS — Z1231 Encounter for screening mammogram for malignant neoplasm of breast: Secondary | ICD-10-CM

## 2016-12-21 ENCOUNTER — Ambulatory Visit
Admission: RE | Admit: 2016-12-21 | Discharge: 2016-12-21 | Disposition: A | Discharge status: Home / Self Care | Payer: Medicare Other | Source: Ambulatory Visit | Attending: Internal Medicine | Admitting: Internal Medicine

## 2016-12-21 DIAGNOSIS — Z1231 Encounter for screening mammogram for malignant neoplasm of breast: Secondary | ICD-10-CM

## 2017-04-16 ENCOUNTER — Encounter (HOSPITAL_COMMUNITY): Payer: Self-pay

## 2017-04-16 ENCOUNTER — Ambulatory Visit (HOSPITAL_COMMUNITY)
Admission: EM | Admit: 2017-04-16 | Discharge: 2017-04-16 | Disposition: A | Discharge status: Home / Self Care | Payer: Federal, State, Local not specified - PPO | Source: Home / Self Care

## 2017-04-16 ENCOUNTER — Emergency Department (HOSPITAL_COMMUNITY): Payer: Medicare Other

## 2017-04-16 ENCOUNTER — Emergency Department (HOSPITAL_COMMUNITY)
Admission: EM | Admit: 2017-04-16 | Discharge: 2017-04-16 | Disposition: A | Discharge status: Home / Self Care | Payer: Medicare Other | Attending: Emergency Medicine | Admitting: Emergency Medicine

## 2017-04-16 ENCOUNTER — Ambulatory Visit (INDEPENDENT_AMBULATORY_CARE_PROVIDER_SITE_OTHER): Payer: Federal, State, Local not specified - PPO

## 2017-04-16 ENCOUNTER — Encounter (HOSPITAL_COMMUNITY): Payer: Self-pay | Admitting: Family Medicine

## 2017-04-16 DIAGNOSIS — R0989 Other specified symptoms and signs involving the circulatory and respiratory systems: Secondary | ICD-10-CM

## 2017-04-16 DIAGNOSIS — E7800 Pure hypercholesterolemia, unspecified: Secondary | ICD-10-CM | POA: Diagnosis not present

## 2017-04-16 DIAGNOSIS — I10 Essential (primary) hypertension: Secondary | ICD-10-CM | POA: Diagnosis not present

## 2017-04-16 DIAGNOSIS — Z79899 Other long term (current) drug therapy: Secondary | ICD-10-CM | POA: Insufficient documentation

## 2017-04-16 DIAGNOSIS — D3702 Neoplasm of uncertain behavior of tongue: Secondary | ICD-10-CM | POA: Diagnosis not present

## 2017-04-16 DIAGNOSIS — R220 Localized swelling, mass and lump, head: Secondary | ICD-10-CM

## 2017-04-16 DIAGNOSIS — K148 Other diseases of tongue: Secondary | ICD-10-CM

## 2017-04-16 LAB — I-STAT CHEM 8, ED
BUN: 10 mg/dL (ref 6–20)
CALCIUM ION: 1.22 mmol/L (ref 1.15–1.40)
CHLORIDE: 105 mmol/L (ref 101–111)
CREATININE: 0.6 mg/dL (ref 0.44–1.00)
GLUCOSE: 93 mg/dL (ref 65–99)
HCT: 44 % (ref 36.0–46.0)
Hemoglobin: 15 g/dL (ref 12.0–15.0)
POTASSIUM: 3.5 mmol/L (ref 3.5–5.1)
Sodium: 143 mmol/L (ref 135–145)
TCO2: 27 mmol/L (ref 0–100)

## 2017-04-16 MED ORDER — GI COCKTAIL ~~LOC~~
30.0000 mL | Freq: Once | ORAL | Status: AC
  Administered 2017-04-16: 30 mL via ORAL
  Filled 2017-04-16: qty 30

## 2017-04-16 MED ORDER — IOPAMIDOL (ISOVUE-300) INJECTION 61%
INTRAVENOUS | Status: AC
Start: 2017-04-16 — End: 2017-04-16
  Administered 2017-04-16: 75 mL
  Filled 2017-04-16: qty 75

## 2017-04-16 NOTE — ED Notes (Signed)
ED Provider at bedside. 

## 2017-04-16 NOTE — ED Triage Notes (Signed)
Pt presents to the ed with complaints of eating fish on Friday and there being a bone in one piece, patient states that she has felt it several times but cannot get it out of her mouth, states that when she turns her head she can feel it stuck on the left side of her mouth somewhere, no fish bone present in mouth on this rn assessment. Pt denies shortness of breath or pain, endorses one episode of vomiting.

## 2017-04-16 NOTE — ED Notes (Signed)
Pt unable to sign upon discharge due to signature pad not working. Pt verbalized understanding of discharge instructions and follow up care. Pt had no further questions. Pt a.o, ambulatory upon discharge

## 2017-04-16 NOTE — ED Notes (Signed)
Patient transported to CT 

## 2017-04-16 NOTE — ED Triage Notes (Signed)
Pt  States   Was  Eating  Fish  3  Days   Ago  Feels  Like  A  Small  Fishbone  In throat     Able  To  Eat  And  Is  In no   resp  Distress   Airway  Is  intact

## 2017-04-16 NOTE — ED Provider Notes (Signed)
[facility] DEPT Provider Note   CSN: [id number] Arrival date & time: 04/16/17  1101     History   Chief Complaint Chief Complaint  Patient presents with  . Foreign Body    HPI Leslie Holmes is a 71 y.o. female.  HPI Patient ports abnormal sensation in her throat since swallowing fish 4 days ago.  She thinks there may be a fish bone still lives there.  She was seen in the urgent care and plain film was performed that demonstrated no foreign body or evidence of perforation and she was sent to the ER for further evaluation.  Patient reports that she is able to keep fluids and solids down.  She reports that when she swallows she has pain on the left side of her neck.  No other complaints.  Symptoms are mild in severity.  No vomiting.  No chest pain.   Past Medical History:  Diagnosis Date  . 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 GERD (gastroesophageal reflux disease) 03/11/2013  . HTN (hypertension) 03/11/2013  . Hypercholesteremia 03/11/2013  . Rheumatoid arthritis(714.0) 03/11/2013   Takes Methotrexate 2x/week     Patient Active Problem List   Diagnosis Date Noted  . 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 HTN (hypertension) 03/11/2013  . GERD (gastroesophageal reflux disease) 03/11/2013  . Hypercholesteremia 03/11/2013  . Obesity, Class III, BMI 40-49.9 (morbid obesity) ([place]) 03/11/2013  . Rheumatoid arthritis(714.0) 03/11/2013    Past Surgical History:  Procedure Laterality Date  . BUNIONECTOMY    . COLONOSCOPY W/ BIOPSIES    . DILATION AND CURETTAGE OF UTERUS    . LAPAROSCOPIC PARTIAL COLECTOMY N/A 04/10/2013   Procedure: LAPAROSCOPIC assisted low anterior resection, rigid proctoscopy;  Surgeon: Adin Hector, MD;  Location: [facility];  Service: General;  Laterality: N/A;  . TUBAL LIGATION      OB History    No data available       Home Medications    Prior to Admission medications   Medication Sig Start Date End Date Taking?  Authorizing Provider  amLODipine (NORVASC) 5 MG tablet Take 5 mg by mouth daily. 02/14/17  Yes [provider]  calcium carbonate (OS-CAL) 600 MG TABS Take 600 mg by mouth daily.   Yes [provider]  cyclobenzaprine (FLEXERIL) 10 MG tablet Take 10 mg by mouth daily.   Yes [provider]  folic acid (FOLVITE) 1 MG tablet Take 1 mg by mouth daily.  01/31/13  Yes [provider]  losartan-hydrochlorothiazide (HYZAAR) 50-12.5 MG tablet Take 1 tablet by mouth daily. D 08/29/14  Yes [provider]  methotrexate (RHEUMATREX) 10 MG tablet Take 10 mg by mouth See admin instructions. Caution: Chemotherapy. Protect from light. Take 40 mg on Tuesday and 40 mg on Thursday   Yes [provider]  omeprazole (PRILOSEC) 20 MG capsule Take 20 mg by mouth as needed for congestion.   Yes [provider]  pravastatin (PRAVACHOL) 40 MG tablet Take 40 mg by mouth daily.  01/29/13  Yes [provider]  predniSONE (DELTASONE) 10 MG tablet Take 3 tablets (30 mg total) by mouth daily. Patient taking differently: Take 10 mg by mouth daily.  02/28/15  Yes Gregor Hams, MD  guaiFENesin-codeine 100-10 MG/5ML syrup Take 5 mLs by mouth at bedtime as needed for cough. Patient not taking: Reported on 04/16/2017 02/28/15   Gregor Hams, MD  meclizine (ANTIVERT) 12.5 MG tablet Take 1 tablet (  12.5 mg total) by mouth 3 (three) times daily as needed for dizziness. Patient not taking: Reported on 04/16/2017 03/03/14   Gregor Hams, MD    Family History Family History  Problem Relation Age of Onset  . Heart disease Mother   . Hypertension Mother   . Breast cancer Neg Hx     Social History Social History  Substance Use Topics  . Smoking status: Never Smoker  . Smokeless tobacco: Never Used  . Alcohol use Yes     Comment: rare     Allergies   Patient has no known allergies.   Review of Systems Review of Systems  All other systems reviewed and are  negative.    Physical Exam Updated Vital Signs BP (!) 159/64   Pulse 75   Temp 98.2 F (36.8 C) (Oral)   Resp 18   Ht 5\' 2"  (1.575 m)   Wt 98 kg (216 lb)   SpO2 98%   BMI 39.51 kg/m   Physical Exam  Constitutional: She is oriented to person, place, and time. She appears well-developed and well-nourished.  HENT:  Head: Normocephalic and atraumatic.  Uvula midline.  Visible tongue normal.  Tolerating secretions.  Oral airway patent.  No swelling  Eyes: EOM are normal.  Neck: Normal range of motion. No tracheal deviation present. No thyromegaly present.  Anterior neck soft  Pulmonary/Chest: Effort normal. No stridor.  Abdominal: She exhibits no distension.  Musculoskeletal: Normal range of motion.  Lymphadenopathy:    She has no cervical adenopathy.  Neurological: She is alert and oriented to person, place, and time.  Psychiatric: She has a normal mood and affect.  Nursing note and vitals reviewed.    ED Treatments / Results  Labs (all labs ordered are listed, but only abnormal results are displayed) Labs Reviewed  I-STAT CHEM 8, ED    EKG  EKG Interpretation None       Radiology Dg Neck Soft Tissue  Result Date: 04/16/2017 CLINICAL DATA:  71 year old female eating fish 4 days ago and feels as if something is stuck in throat. Evaluate for foreign body. Initial encounter. EXAM: NECK SOFT TISSUES - 1+ VIEW COMPARISON:  None. FINDINGS: No definitive radiopaque foreign body noted. Evaluation limited by tracheal cartilage. No abnormal prevertebral soft tissue swelling or soft tissue gas noted to indicate secondary findings of perforation. If further delineation were clinically desired CT imaging may then be considered. IMPRESSION: No definitive radiopaque foreign body noted. Evaluation limited by tracheal cartilage. No secondary findings of perforation. Electronically Signed   By: Genia Del M.D.   On: 04/16/2017 10:50   Ct Soft Tissue Neck W Contrast  Result Date:  04/16/2017 CLINICAL DATA:  Globus sensation. Feels like fish bone is stuck in left side of neck. Sensation for 3 days. EXAM: CT NECK WITH CONTRAST TECHNIQUE: Multidetector CT imaging of the neck was performed using the standard protocol following the bolus administration of intravenous contrast. CONTRAST:  25mL ISOVUE-300 IOPAMIDOL (ISOVUE-300) INJECTION 61% COMPARISON:  Soft tissue neck radiographs 04/16/2017. FINDINGS: Pharynx and larynx: Bulky soft tissue is present at the base of tongue extending into the vallecula. This crosses midline and measures 2.2 x 1.3 x 2.5 cm. The lesion does not clearly extend into the vallecula. It abuts the epiglottis without definite involvement of the epiglottis. Slight asymmetry is noted in the left palatine tonsil. The hypopharynx is clear. Vocal cords are midline and symmetric. No significant nasopharyngeal lesion is evident. Salivary glands: The submandibular and parotid glands  are within normal limits bilaterally. Thyroid: Negative Lymph nodes: The largest left sided lymph node is a level 2 node measuring 16 x 9 x 13 mm. No significantly enlarged right-sided lymph nodes are present. Vascular: There is medial deviation of the internal carotid artery's bilaterally extending posterior to the hypopharynx. No significant stenosis or vascular disease is evident. Minimal calcifications are present at the aortic arch. Limited intracranial: Within normal limits Visualized orbits: Bilateral lens replacements are present. The globes and orbits are within normal limits bilaterally. Mastoids and visualized paranasal sinuses: Minimal fluid is present within the right sphenoid sinus. A small polyp or mucous retention cyst is present at the inferior right maxillary sinus. The paranasal sinuses and mastoid air cells are otherwise clear. Skeleton: Mild degenerative changes are present in the cervical spine. No focal lytic or blastic lesions are present. Upper chest: The lung apices are clear.  IMPRESSION: 1. No radiopaque foreign body. 2. Hyperdense mass lesion at the base of tongue measuring 2.2 x 1.3 x 2.5 cm without definite extension into the the vallecula or epiglottis. Recommend direct visualization. 3. Suspicious left level 2 lymph node measures 16 x 9 x 13 mm. If the lesion at the base of tongue is a squamous cell carcinoma, this may represent a metastatic node. PET scanning may be useful for further evaluation. These results were called by telephone at the time of interpretation on 04/16/2017 at 3:52 pm to Dr. Jola Schmidt , who verbally acknowledged these results. Electronically Signed   By: San Morelle M.D.   On: 04/16/2017 15:52    Procedures Procedures (including critical care time)  Medications Ordered in ED Medications  gi cocktail (Maalox,Lidocaine,Donnatal) (30 mLs Oral Given 04/16/17 1447)  iopamidol (ISOVUE-300) 61 % injection (75 mLs  Contrast Given 04/16/17 1518)     Initial Impression / Assessment and Plan / ED Course  I have reviewed the triage vital signs and the nursing notes.  Pertinent labs & imaging results that were available during my care of the patient were reviewed by me and considered in my medical decision making (see chart for details).   no obvious foreign body noted.  No signs of esophageal perforation.  Patient feels much better after GI cocktail.  Overall well-appearing.  Airway patent.  CT demonstrates base of the tongue mass.  Patient has been made aware of this.  She understands the importance of close ENT follow-up with direct visualization and likely biopsy for further evaluation.  She understands there is a high likelihood this could represent cancer.  No indication for additional workup at this time.  Discharge home.  She understands to return to the ER for new or worsening symptoms  Final Clinical Impressions(s) / ED Diagnoses   Final diagnoses:  Mass of tongue    New Prescriptions Discharge Medication List as of 04/16/2017   4:20 PM       Jola Schmidt, MD 04/16/17 1643

## 2017-04-16 NOTE — Discharge Instructions (Signed)
We don't see the fish bone with direct visualization or on the x-ray.  Therefore, we are sending you to the emergency department for further evaluation.

## 2017-04-16 NOTE — ED Provider Notes (Signed)
[facility]    CSN: [id number] Arrival date & time: 04/16/17  5284     History   Chief Complaint Chief Complaint  Patient presents with  . Foreign Body    HPI Leslie Holmes is a 71 y.o. female.   This 70 year old woman who was eating fish on Friday night (4 days ago) and felt that she swallowed a fishbone. She is currently experiencing a foreign body sensation in the left posterior pharynx. She's tried to dislodge the bone by eating bread and then inducing emesis, neither of which worked. She says that the foreign body sensation his move from one side to the other over the last few days.      Past Medical History:  Diagnosis Date  . 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 GERD (gastroesophageal reflux disease) 03/11/2013  . HTN (hypertension) 03/11/2013  . Hypercholesteremia 03/11/2013  . Rheumatoid arthritis(714.0) 03/11/2013   Takes Methotrexate 2x/week     Patient Active Problem List   Diagnosis Date Noted  . 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 HTN (hypertension) 03/11/2013  . GERD (gastroesophageal reflux disease) 03/11/2013  . Hypercholesteremia 03/11/2013  . Obesity, Class III, BMI 40-49.9 (morbid obesity) ([place]) 03/11/2013  . Rheumatoid arthritis(714.0) 03/11/2013    Past Surgical History:  Procedure Laterality Date  . BUNIONECTOMY    . COLONOSCOPY W/ BIOPSIES    . DILATION AND CURETTAGE OF UTERUS    . LAPAROSCOPIC PARTIAL COLECTOMY N/A 04/10/2013   Procedure: LAPAROSCOPIC assisted low anterior resection, rigid proctoscopy;  Surgeon: Adin Hector, MD;  Location: [facility];  Service: General;  Laterality: N/A;  . TUBAL LIGATION      OB History    No data available       Home Medications    Prior to Admission medications   Medication Sig Start Date End Date Taking? Authorizing Provider  calcium carbonate (OS-CAL) 600 MG TABS Take 600 mg by mouth daily.    [provider]  folic acid (FOLVITE) 1 MG tablet  Take 1 mg by mouth daily.  01/31/13   [provider]  guaiFENesin-codeine 100-10 MG/5ML syrup Take 5 mLs by mouth at bedtime as needed for cough. 02/28/15   Gregor Hams, MD  lisinopril-hydrochlorothiazide (PRINZIDE,ZESTORETIC) 20-25 MG per tablet Take 1 tablet by mouth at bedtime.  01/29/13   [provider]  meclizine (ANTIVERT) 12.5 MG tablet Take 1 tablet (12.5 mg total) by mouth 3 (three) times daily as needed for dizziness. 03/03/14   Gregor Hams, MD  methotrexate (RHEUMATREX) 2.5 MG tablet 10 mg 2 (two) times a week.  01/29/13   [provider]  Multiple Vitamin (MULTIVITAMIN WITH MINERALS) TABS Take 1 tablet by mouth daily.    [provider]  pravastatin (PRAVACHOL) 40 MG tablet Take 40 mg by mouth at bedtime.  01/29/13   [provider]  predniSONE (DELTASONE) 10 MG tablet Take 3 tablets (30 mg total) by mouth daily. 02/28/15   Gregor Hams, MD    Family History Family History  Problem Relation Age of Onset  . Heart disease Mother   . Hypertension Mother   . Breast cancer Neg Hx     Social History Social History  Substance Use Topics  . Smoking status: Never Smoker  . Smokeless tobacco: Never Used  . Alcohol use Yes     Comment: rare     Allergies   Patient has no known allergies.  Review of Systems Review of Systems  HENT: Positive for sore throat.   All other systems reviewed and are negative.    Physical Exam Triage Vital Signs ED Triage Vitals  Enc Vitals Group     BP      Pulse      Resp      Temp      Temp src      SpO2      Weight      Height      Head Circumference      Peak Flow      Pain Score      Pain Loc      Pain Edu?      Excl. in [place]?    No data found.   Updated Vital Signs BP (!) 168/71 (BP Location: Left Arm) Comment: notified rn  Pulse 94   Temp 98.5 F (36.9 C) (Oral)   Resp 16   SpO2 98%      Physical Exam  Constitutional: She appears well-developed and well-nourished.  HENT:    Right Ear: External ear normal.  Left Ear: External ear normal.  Erythematous posterior pharynx, no foreign body visualized  Eyes: Conjunctivae and EOM are normal. Pupils are equal, round, and reactive to light.  Neck: Normal range of motion. Neck supple. No tracheal deviation present.  Pulmonary/Chest: Effort normal. No stridor.  Musculoskeletal: Normal range of motion.  Lymphadenopathy:    She has no cervical adenopathy.  Neurological: She is alert.  Skin: Skin is warm and dry.  Nursing note and vitals reviewed.    UC Treatments / Results  Labs (all labs ordered are listed, but only abnormal results are displayed) Labs Reviewed - No data to display  EKG  EKG Interpretation None       Radiology No results found.  Procedures Procedures (including critical care time)  Medications Ordered in UC Medications - No data to display   Initial Impression / Assessment and Plan / UC Course  I have reviewed the triage vital signs and the nursing notes.  Pertinent labs & imaging results that were available during my care of the patient were reviewed by me and considered in my medical decision making (see chart for details).     Final Clinical Impressions(s) / UC Diagnoses   Final diagnoses:  Foreign body sensation in throat    New Prescriptions New Prescriptions   No medications on file  We don't see the fish bone with direct visualization or on the x-ray.  Therefore, we are sending you to the emergency department for further evaluation.   Robyn Haber, MD 04/16/17 1051

## 2017-04-16 NOTE — Discharge Instructions (Signed)
It is very important that you follow-up with the ear nose and throat surgeon for further evaluation of the mass at the base of your tongue

## 2017-05-28 ENCOUNTER — Other Ambulatory Visit: Payer: Self-pay

## 2017-11-25 ENCOUNTER — Other Ambulatory Visit: Payer: Self-pay | Admitting: Internal Medicine

## 2017-11-25 DIAGNOSIS — Z1231 Encounter for screening mammogram for malignant neoplasm of breast: Secondary | ICD-10-CM

## 2017-12-23 ENCOUNTER — Ambulatory Visit
Admission: RE | Admit: 2017-12-23 | Discharge: 2017-12-23 | Disposition: A | Discharge status: Home / Self Care | Payer: Medicare Other | Source: Ambulatory Visit | Attending: Internal Medicine | Admitting: Internal Medicine

## 2017-12-23 DIAGNOSIS — Z1231 Encounter for screening mammogram for malignant neoplasm of breast: Secondary | ICD-10-CM

## 2018-05-16 ENCOUNTER — Other Ambulatory Visit: Payer: Self-pay | Admitting: Internal Medicine

## 2018-05-16 DIAGNOSIS — E2839 Other primary ovarian failure: Secondary | ICD-10-CM

## 2018-07-02 ENCOUNTER — Other Ambulatory Visit: Payer: Medicare Other

## 2018-08-26 ENCOUNTER — Other Ambulatory Visit: Payer: Medicare Other

## 2018-10-02 ENCOUNTER — Ambulatory Visit
Admission: RE | Admit: 2018-10-02 | Discharge: 2018-10-02 | Disposition: A | Discharge status: Home / Self Care | Payer: Medicare Other | Source: Ambulatory Visit | Attending: Internal Medicine | Admitting: Internal Medicine

## 2018-10-02 DIAGNOSIS — E2839 Other primary ovarian failure: Secondary | ICD-10-CM

## 2019-04-08 ENCOUNTER — Other Ambulatory Visit: Payer: Self-pay | Admitting: Internal Medicine

## 2019-04-08 DIAGNOSIS — Z1231 Encounter for screening mammogram for malignant neoplasm of breast: Secondary | ICD-10-CM

## 2019-05-21 ENCOUNTER — Other Ambulatory Visit: Payer: Self-pay

## 2019-05-21 ENCOUNTER — Ambulatory Visit
Admission: RE | Admit: 2019-05-21 | Discharge: 2019-05-21 | Disposition: A | Discharge status: Home / Self Care | Payer: Medicare Other | Source: Ambulatory Visit | Attending: Internal Medicine | Admitting: Internal Medicine

## 2019-05-21 DIAGNOSIS — Z1231 Encounter for screening mammogram for malignant neoplasm of breast: Secondary | ICD-10-CM

## 2020-05-25 ENCOUNTER — Other Ambulatory Visit: Payer: Self-pay | Admitting: Internal Medicine

## 2020-05-25 DIAGNOSIS — Z1231 Encounter for screening mammogram for malignant neoplasm of breast: Secondary | ICD-10-CM

## 2020-05-31 ENCOUNTER — Ambulatory Visit: Payer: Medicare Other

## 2020-06-30 ENCOUNTER — Ambulatory Visit
Admission: RE | Admit: 2020-06-30 | Discharge: 2020-06-30 | Disposition: A | Discharge status: Home / Self Care | Payer: Medicare Other | Source: Ambulatory Visit | Attending: Internal Medicine | Admitting: Internal Medicine

## 2020-06-30 ENCOUNTER — Other Ambulatory Visit: Payer: Self-pay

## 2020-06-30 DIAGNOSIS — Z1231 Encounter for screening mammogram for malignant neoplasm of breast: Secondary | ICD-10-CM

## 2021-08-08 ENCOUNTER — Other Ambulatory Visit: Payer: Self-pay | Admitting: Internal Medicine

## 2021-08-08 ENCOUNTER — Ambulatory Visit: Payer: Medicare Other

## 2021-08-08 DIAGNOSIS — Z1231 Encounter for screening mammogram for malignant neoplasm of breast: Secondary | ICD-10-CM

## 2021-08-16 ENCOUNTER — Ambulatory Visit
Admission: RE | Admit: 2021-08-16 | Discharge: 2021-08-16 | Disposition: A | Discharge status: Home / Self Care | Payer: Medicare Other | Source: Ambulatory Visit | Attending: Internal Medicine | Admitting: Internal Medicine

## 2021-08-16 ENCOUNTER — Other Ambulatory Visit: Payer: Self-pay

## 2021-08-16 DIAGNOSIS — Z1231 Encounter for screening mammogram for malignant neoplasm of breast: Secondary | ICD-10-CM

## 2022-01-11 ENCOUNTER — Other Ambulatory Visit: Payer: Self-pay | Admitting: Internal Medicine

## 2022-01-12 LAB — COMPLETE METABOLIC PANEL WITH GFR
AG Ratio: 1.4 (calc) (ref 1.0–2.5)
ALT: 19 U/L (ref 6–29)
AST: 19 U/L (ref 10–35)
Albumin: 4.4 g/dL (ref 3.6–5.1)
Alkaline phosphatase (APISO): 58 U/L (ref 37–153)
BUN: 12 mg/dL (ref 7–25)
CO2: 24 mmol/L (ref 20–32)
Calcium: 10.2 mg/dL (ref 8.6–10.4)
Chloride: 102 mmol/L (ref 98–110)
Creat: 0.65 mg/dL (ref 0.60–1.00)
Globulin: 3.1 g/dL (calc) (ref 1.9–3.7)
Glucose, Bld: 92 mg/dL (ref 65–99)
Potassium: 3.6 mmol/L (ref 3.5–5.3)
Sodium: 140 mmol/L (ref 135–146)
Total Bilirubin: 0.6 mg/dL (ref 0.2–1.2)
Total Protein: 7.5 g/dL (ref 6.1–8.1)
eGFR: 93 mL/min/{1.73_m2} (ref >=60)

## 2022-01-12 LAB — CBC
HCT: 41.3 % (ref 35.0–45.0)
Hemoglobin: 14 g/dL (ref 11.7–15.5)
MCH: 32.7 pg (ref 27.0–33.0)
MCHC: 33.9 g/dL (ref 32.0–36.0)
MCV: 96.5 fL (ref 80.0–100.0)
MPV: 11.9 fL (ref 7.5–12.5)
Platelets: 323 10*3/uL (ref 140–400)
RBC: 4.28 10*6/uL (ref 3.80–5.10)
RDW: 14.5 % (ref 11.0–15.0)
WBC: 11.6 10*3/uL — ABNORMAL HIGH (ref 3.8–10.8)

## 2022-01-12 LAB — LIPID PANEL
Cholesterol: 186 mg/dL (ref <200)
HDL: 54 mg/dL (ref >=50)
LDL Cholesterol (Calc): 118 mg/dL (calc) — ABNORMAL HIGH
Non-HDL Cholesterol (Calc): 132 mg/dL (calc) — ABNORMAL HIGH (ref <130)
Total CHOL/HDL Ratio: 3.4 (calc) (ref <5.0)
Triglycerides: 58 mg/dL (ref <150)

## 2022-01-12 LAB — TSH: TSH: 1.64 mIU/L (ref 0.40–4.50)

## 2022-07-10 ENCOUNTER — Other Ambulatory Visit: Payer: Self-pay | Admitting: Internal Medicine

## 2022-07-11 LAB — CBC WITH DIFFERENTIAL/PLATELET
Absolute Monocytes: 662 cells/uL (ref 200–950)
Basophils Absolute: 129 cells/uL (ref 0–200)
Basophils Relative: 1.5 %
Eosinophils Absolute: 163 cells/uL (ref 15–500)
Eosinophils Relative: 1.9 %
HCT: 38.8 % (ref 35.0–45.0)
Hemoglobin: 13.4 g/dL (ref 11.7–15.5)
Lymphs Abs: 1591 cells/uL (ref 850–3900)
MCH: 32.1 pg (ref 27.0–33.0)
MCHC: 34.5 g/dL (ref 32.0–36.0)
MCV: 93 fL (ref 80.0–100.0)
MPV: 11.6 fL (ref 7.5–12.5)
Monocytes Relative: 7.7 %
Neutro Abs: 6054 cells/uL (ref 1500–7800)
Neutrophils Relative %: 70.4 %
Platelets: 295 10*3/uL (ref 140–400)
RBC: 4.17 10*6/uL (ref 3.80–5.10)
RDW: 14.6 % (ref 11.0–15.0)
Total Lymphocyte: 18.5 %
WBC: 8.6 10*3/uL (ref 3.8–10.8)

## 2022-07-11 LAB — BASIC METABOLIC PANEL WITH GFR
BUN: 11 mg/dL (ref 7–25)
CO2: 26 mmol/L (ref 20–32)
Calcium: 10 mg/dL (ref 8.6–10.4)
Chloride: 104 mmol/L (ref 98–110)
Creat: 0.67 mg/dL (ref 0.60–1.00)
Glucose, Bld: 79 mg/dL (ref 65–99)
Potassium: 3.9 mmol/L (ref 3.5–5.3)
Sodium: 142 mmol/L (ref 135–146)
eGFR: 92 mL/min/{1.73_m2} (ref >=60)

## 2022-07-11 LAB — C-REACTIVE PROTEIN: CRP: 2.4 mg/L (ref <8.0)

## 2022-07-24 ENCOUNTER — Other Ambulatory Visit: Payer: Self-pay | Admitting: Internal Medicine

## 2022-07-24 DIAGNOSIS — Z1231 Encounter for screening mammogram for malignant neoplasm of breast: Secondary | ICD-10-CM

## 2022-08-09 IMAGING — MG MM DIGITAL SCREENING BILAT W/ TOMO AND CAD
6 of 12 series · 6 of 36 positions shown · non-contrast
Comparison: Previous exam(s).

CLINICAL DATA: Screening.

EXAM:
DIGITAL SCREENING BILATERAL MAMMOGRAM WITH TOMOSYNTHESIS AND CAD
TECHNIQUE: Bilateral screening digital craniocaudal and mediolateral oblique
mammograms were obtained. Bilateral screening digital breast
tomosynthesis was performed. The images were evaluated with
computer-aided detection.

[R MLO synth-2D (1 of 2)]
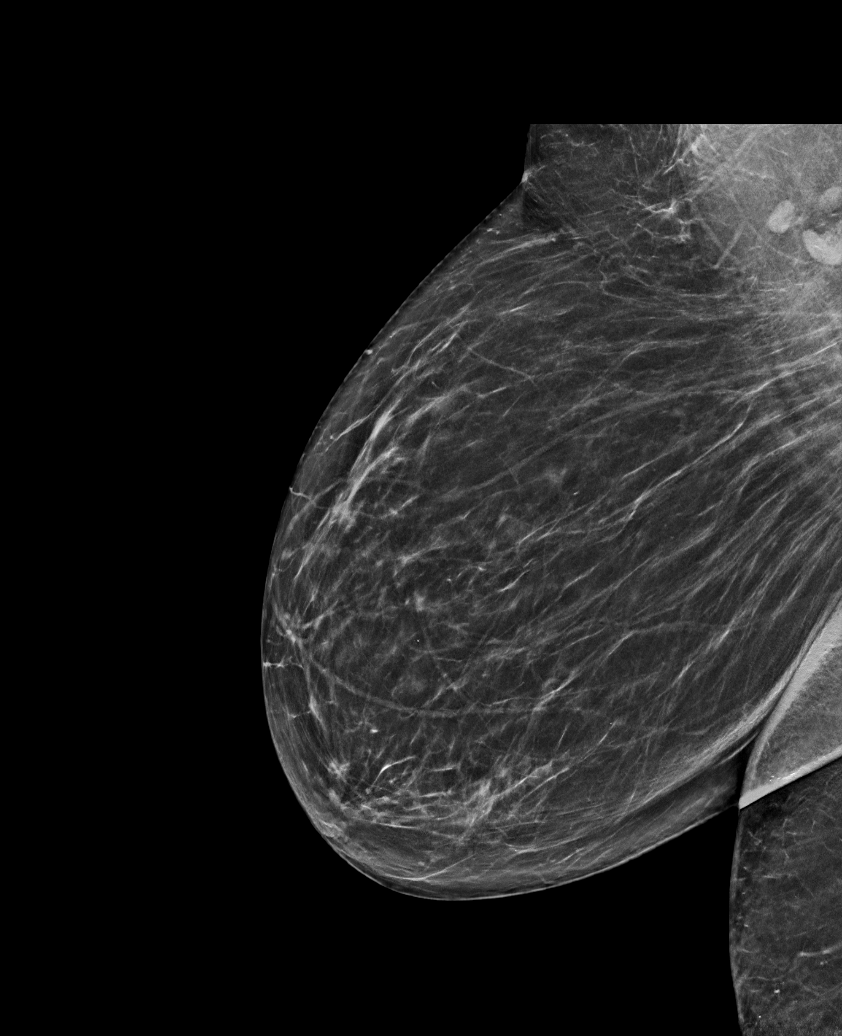

[R CC synth-2D]
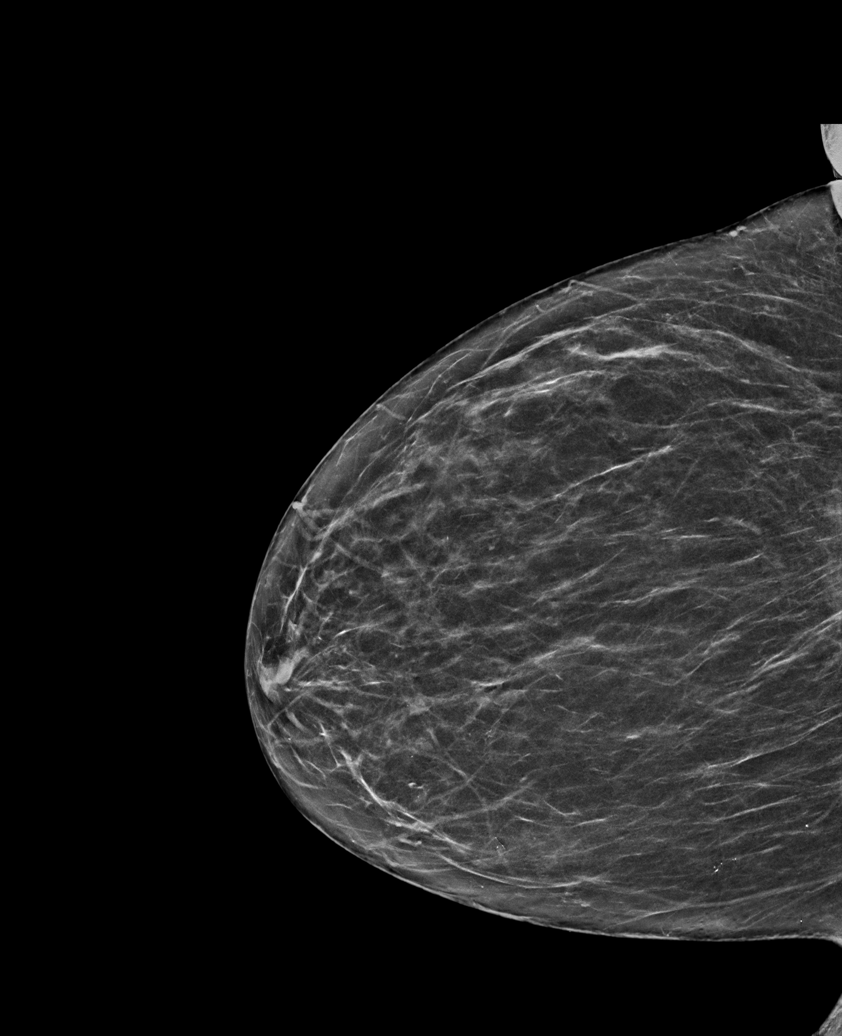

[L MLO synth-2D (1 of 2)]
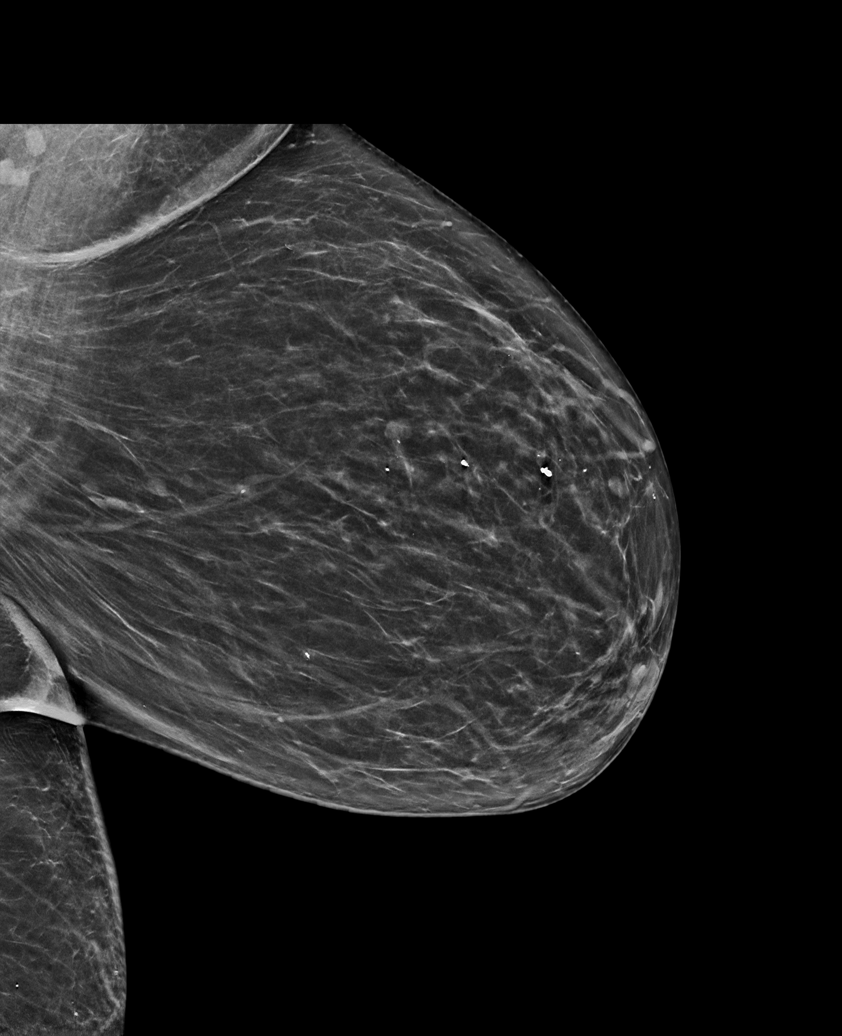

[L MLO synth-2D (2 of 2)]
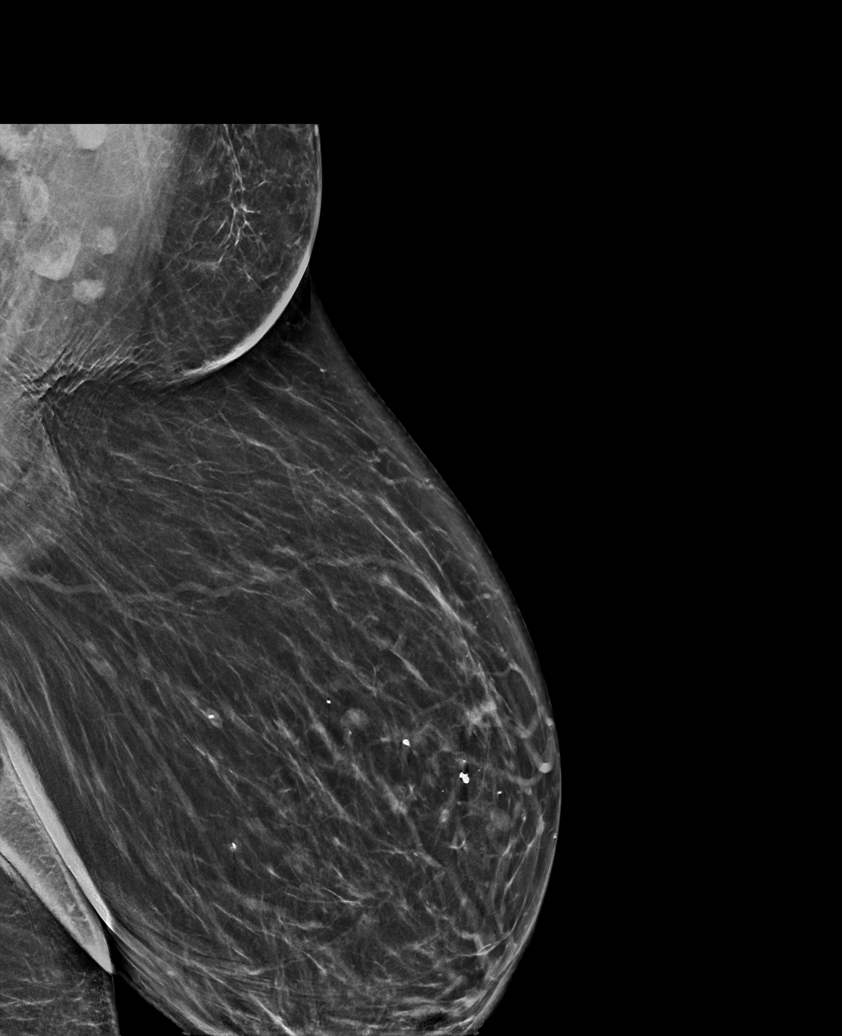

[L CC synth-2D]
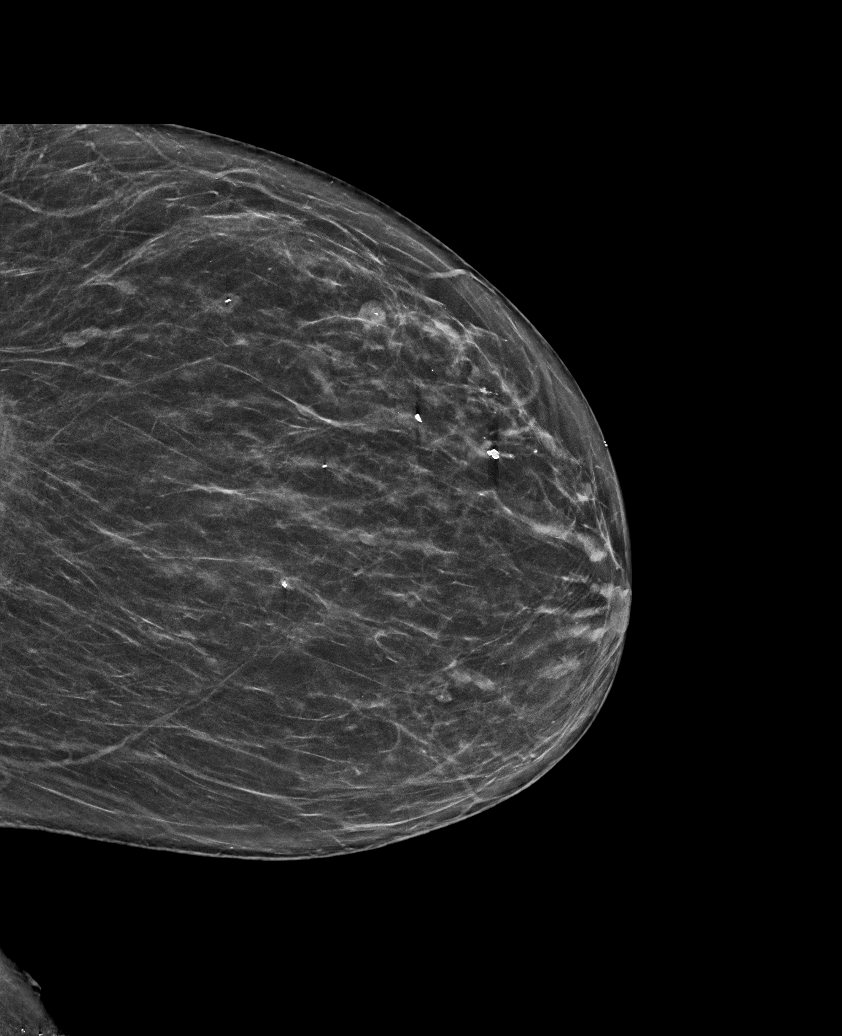

[R MLO synth-2D (2 of 2)]
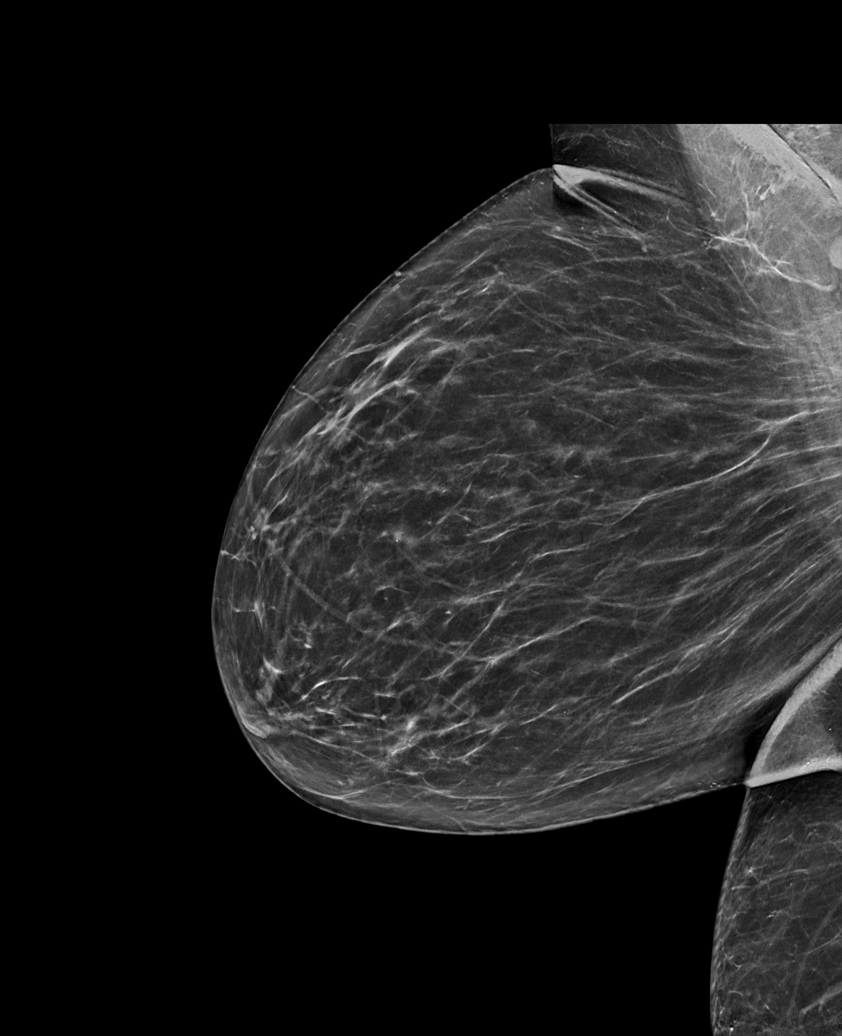

[6 of 36 positions shown; findings below may reference images not displayed]

ACR Breast Density Category b: There are scattered areas of
fibroglandular density.
FINDINGS: There are no findings suspicious for malignancy.
IMPRESSION: No mammographic evidence of malignancy. A result letter of this
screening mammogram will be mailed directly to the patient.

RECOMMENDATION:
Screening mammogram in one year. (Code:51-O-LD2)

BI-RADS CATEGORY  1: Negative.

## 2022-09-04 ENCOUNTER — Ambulatory Visit: Payer: Medicare Other

## 2022-09-06 ENCOUNTER — Ambulatory Visit (HOSPITAL_COMMUNITY)
Admission: EM | Admit: 2022-09-06 | Discharge: 2022-09-06 | Discharge status: Against Medical Advice | Payer: Medicare Other

## 2022-09-06 NOTE — ED Notes (Signed)
Informed by the front desk that Patient has left AMA.

## 2023-01-17 ENCOUNTER — Other Ambulatory Visit: Payer: Self-pay | Admitting: Internal Medicine

## 2023-01-18 LAB — LIPID PANEL
Cholesterol: 190 mg/dL (ref <200)
HDL: 57 mg/dL (ref >=50)
LDL Cholesterol (Calc): 113 mg/dL (calc) — ABNORMAL HIGH
Non-HDL Cholesterol (Calc): 133 mg/dL (calc) — ABNORMAL HIGH (ref <130)
Total CHOL/HDL Ratio: 3.3 (calc) (ref <5.0)
Triglycerides: 95 mg/dL (ref <150)

## 2023-01-18 LAB — COMPLETE METABOLIC PANEL WITH GFR
AG Ratio: 1.4 (calc) (ref 1.0–2.5)
ALT: 24 U/L (ref 6–29)
AST: 21 U/L (ref 10–35)
Albumin: 4.5 g/dL (ref 3.6–5.1)
Alkaline phosphatase (APISO): 63 U/L (ref 37–153)
BUN: 11 mg/dL (ref 7–25)
CO2: 26 mmol/L (ref 20–32)
Calcium: 10.3 mg/dL (ref 8.6–10.4)
Chloride: 103 mmol/L (ref 98–110)
Creat: 0.71 mg/dL (ref 0.60–1.00)
Globulin: 3.3 g/dL (calc) (ref 1.9–3.7)
Glucose, Bld: 78 mg/dL (ref 65–99)
Potassium: 4 mmol/L (ref 3.5–5.3)
Sodium: 142 mmol/L (ref 135–146)
Total Bilirubin: 0.4 mg/dL (ref 0.2–1.2)
Total Protein: 7.8 g/dL (ref 6.1–8.1)
eGFR: 89 mL/min/{1.73_m2} (ref >=60)

## 2023-01-18 LAB — CBC
HCT: 41.7 % (ref 35.0–45.0)
Hemoglobin: 14.2 g/dL (ref 11.7–15.5)
MCH: 31.6 pg (ref 27.0–33.0)
MCHC: 34.1 g/dL (ref 32.0–36.0)
MCV: 92.7 fL (ref 80.0–100.0)
MPV: 12.1 fL (ref 7.5–12.5)
Platelets: 321 10*3/uL (ref 140–400)
RBC: 4.5 10*6/uL (ref 3.80–5.10)
RDW: 14.9 % (ref 11.0–15.0)
WBC: 11 10*3/uL — ABNORMAL HIGH (ref 3.8–10.8)

## 2023-01-18 LAB — TSH: TSH: 1.66 mIU/L (ref 0.40–4.50)

## 2023-02-07 ENCOUNTER — Ambulatory Visit
Admission: RE | Admit: 2023-02-07 | Discharge: 2023-02-07 | Disposition: A | Discharge status: Home / Self Care | Payer: Medicare Other | Source: Ambulatory Visit | Attending: Internal Medicine | Admitting: Internal Medicine

## 2023-02-07 DIAGNOSIS — Z1231 Encounter for screening mammogram for malignant neoplasm of breast: Secondary | ICD-10-CM

## 2024-03-27 ENCOUNTER — Other Ambulatory Visit: Payer: Self-pay | Admitting: Internal Medicine

## 2024-03-27 DIAGNOSIS — Z1231 Encounter for screening mammogram for malignant neoplasm of breast: Secondary | ICD-10-CM

## 2024-04-01 ENCOUNTER — Ambulatory Visit
Admission: RE | Admit: 2024-04-01 | Discharge: 2024-04-01 | Disposition: A | Discharge status: Home / Self Care | Source: Ambulatory Visit | Attending: Internal Medicine | Admitting: Internal Medicine

## 2024-04-01 DIAGNOSIS — Z1231 Encounter for screening mammogram for malignant neoplasm of breast: Secondary | ICD-10-CM
